# Patient Record
Sex: Male | Born: 1965 | Race: White | Hispanic: No | Marital: Married | Smoking: Former smoker
Health system: Southern US, Community
[De-identification: ages and names within clinical notes are randomized; demographics above are authoritative.]

## PROBLEM LIST (undated history)

## (undated) DIAGNOSIS — T7840XA Allergy, unspecified, initial encounter: Secondary | ICD-10-CM

## (undated) DIAGNOSIS — IMO0001 Reserved for inherently not codable concepts without codable children: Secondary | ICD-10-CM

## (undated) DIAGNOSIS — R030 Elevated blood-pressure reading, without diagnosis of hypertension: Secondary | ICD-10-CM

## (undated) DIAGNOSIS — E1165 Type 2 diabetes mellitus with hyperglycemia: Principal | ICD-10-CM

## (undated) HISTORY — PX: ANKLE ARTHROSCOPY: SUR85

## (undated) HISTORY — DX: Allergy, unspecified, initial encounter: T78.40XA

## (undated) HISTORY — DX: Type 2 diabetes mellitus with hyperglycemia: E11.65

## (undated) HISTORY — DX: Reserved for inherently not codable concepts without codable children: IMO0001

## (undated) HISTORY — DX: Elevated blood-pressure reading, without diagnosis of hypertension: R03.0

## (undated) HISTORY — PX: ELBOW ARTHROSCOPY: SUR87

---

## 2000-05-18 ENCOUNTER — Emergency Department (HOSPITAL_COMMUNITY): Admission: EM | Admit: 2000-05-18 | Discharge: 2000-05-18 | Payer: Self-pay | Admitting: Emergency Medicine

## 2000-05-18 ENCOUNTER — Encounter: Payer: Self-pay | Admitting: Emergency Medicine

## 2002-08-02 ENCOUNTER — Encounter: Admission: RE | Admit: 2002-08-02 | Discharge: 2002-10-31 | Payer: Self-pay | Admitting: Internal Medicine

## 2002-11-02 ENCOUNTER — Encounter: Admission: RE | Admit: 2002-11-02 | Discharge: 2003-01-31 | Payer: Self-pay | Admitting: Internal Medicine

## 2004-04-10 ENCOUNTER — Ambulatory Visit: Payer: Self-pay | Admitting: Internal Medicine

## 2005-03-06 ENCOUNTER — Ambulatory Visit: Payer: Self-pay | Admitting: Internal Medicine

## 2005-06-19 ENCOUNTER — Ambulatory Visit: Payer: Self-pay | Admitting: Internal Medicine

## 2005-06-27 ENCOUNTER — Ambulatory Visit: Payer: Self-pay | Admitting: Cardiology

## 2007-01-03 ENCOUNTER — Encounter: Payer: Self-pay | Admitting: Internal Medicine

## 2007-01-03 DIAGNOSIS — J301 Allergic rhinitis due to pollen: Secondary | ICD-10-CM | POA: Insufficient documentation

## 2007-01-03 DIAGNOSIS — I10 Essential (primary) hypertension: Secondary | ICD-10-CM | POA: Insufficient documentation

## 2007-09-15 ENCOUNTER — Encounter: Admission: RE | Admit: 2007-09-15 | Discharge: 2007-09-15 | Payer: Self-pay | Admitting: Orthopaedic Surgery

## 2008-07-07 ENCOUNTER — Other Ambulatory Visit: Payer: Self-pay

## 2008-07-07 ENCOUNTER — Other Ambulatory Visit: Payer: Self-pay | Admitting: *Deleted

## 2010-05-30 ENCOUNTER — Encounter: Payer: Self-pay | Admitting: Internal Medicine

## 2010-05-30 ENCOUNTER — Ambulatory Visit (INDEPENDENT_AMBULATORY_CARE_PROVIDER_SITE_OTHER): Payer: Commercial Managed Care - PPO | Admitting: Internal Medicine

## 2010-05-30 DIAGNOSIS — J301 Allergic rhinitis due to pollen: Secondary | ICD-10-CM

## 2010-05-30 DIAGNOSIS — R002 Palpitations: Secondary | ICD-10-CM

## 2010-05-30 DIAGNOSIS — I10 Essential (primary) hypertension: Secondary | ICD-10-CM

## 2010-05-31 ENCOUNTER — Other Ambulatory Visit: Payer: Commercial Managed Care - PPO

## 2010-05-31 ENCOUNTER — Other Ambulatory Visit: Payer: Self-pay | Admitting: Internal Medicine

## 2010-05-31 ENCOUNTER — Encounter (INDEPENDENT_AMBULATORY_CARE_PROVIDER_SITE_OTHER): Payer: Self-pay | Admitting: *Deleted

## 2010-05-31 DIAGNOSIS — Z0389 Encounter for observation for other suspected diseases and conditions ruled out: Secondary | ICD-10-CM

## 2010-05-31 DIAGNOSIS — I10 Essential (primary) hypertension: Secondary | ICD-10-CM

## 2010-05-31 DIAGNOSIS — E785 Hyperlipidemia, unspecified: Secondary | ICD-10-CM

## 2010-05-31 DIAGNOSIS — Z0000 Encounter for general adult medical examination without abnormal findings: Secondary | ICD-10-CM

## 2010-05-31 LAB — CBC WITH DIFFERENTIAL/PLATELET
Basophils Absolute: 0 10*3/uL (ref 0.0–0.1)
Basophils Relative: 0.7 % (ref 0.0–3.0)
Eosinophils Absolute: 0.3 10*3/uL (ref 0.0–0.7)
Eosinophils Relative: 4.4 % (ref 0.0–5.0)
HCT: 44.3 % (ref 39.0–52.0)
Hemoglobin: 15.6 g/dL (ref 13.0–17.0)
Lymphocytes Relative: 42 % (ref 12.0–46.0)
Lymphs Abs: 2.6 10*3/uL (ref 0.7–4.0)
MCHC: 35.1 g/dL (ref 30.0–36.0)
MCV: 89.2 fl (ref 78.0–100.0)
Monocytes Absolute: 0.4 10*3/uL (ref 0.1–1.0)
Monocytes Relative: 6.9 % (ref 3.0–12.0)
Neutro Abs: 2.9 10*3/uL (ref 1.4–7.7)
Neutrophils Relative %: 46 % (ref 43.0–77.0)
Platelets: 222 10*3/uL (ref 150.0–400.0)
RBC: 4.97 Mil/uL (ref 4.22–5.81)
RDW: 13.6 % (ref 11.5–14.6)
WBC: 6.3 10*3/uL (ref 4.5–10.5)

## 2010-05-31 LAB — URINALYSIS
Bilirubin Urine: NEGATIVE
Hgb urine dipstick: NEGATIVE
Ketones, ur: NEGATIVE
Leukocytes, UA: NEGATIVE
Nitrite: NEGATIVE
Specific Gravity, Urine: 1.01 (ref 1.000–1.030)
Total Protein, Urine: NEGATIVE
Urine Glucose: NEGATIVE
Urobilinogen, UA: 0.2 (ref 0.0–1.0)
pH: 7 (ref 5.0–8.0)

## 2010-05-31 LAB — LIPID PANEL
Cholesterol: 225 mg/dL — ABNORMAL HIGH (ref 0–200)
HDL: 41 mg/dL (ref >39.00)
Total CHOL/HDL Ratio: 5
Triglycerides: 131 mg/dL (ref 0.0–149.0)
VLDL: 26.2 mg/dL (ref 0.0–40.0)

## 2010-05-31 LAB — BASIC METABOLIC PANEL
BUN: 11 mg/dL (ref 6–23)
CO2: 32 mEq/L (ref 19–32)
Calcium: 9.6 mg/dL (ref 8.4–10.5)
Chloride: 96 mEq/L (ref 96–112)
Creatinine, Ser: 1.2 mg/dL (ref 0.4–1.5)
GFR: 68.87 mL/min (ref >60.00)
Glucose, Bld: 133 mg/dL — ABNORMAL HIGH (ref 70–99)
Potassium: 4.5 mEq/L (ref 3.5–5.1)
Sodium: 136 mEq/L (ref 135–145)

## 2010-05-31 LAB — HEPATIC FUNCTION PANEL
ALT: 43 U/L (ref 0–53)
AST: 30 U/L (ref 0–37)
Albumin: 4.5 g/dL (ref 3.5–5.2)
Alkaline Phosphatase: 42 U/L (ref 39–117)
Bilirubin, Direct: 0.1 mg/dL (ref 0.0–0.3)
Total Bilirubin: 0.7 mg/dL (ref 0.3–1.2)
Total Protein: 7.3 g/dL (ref 6.0–8.3)

## 2010-05-31 LAB — TSH: TSH: 2.7 u[IU]/mL (ref 0.35–5.50)

## 2010-05-31 LAB — PSA: PSA: 0.59 ng/mL (ref 0.10–4.00)

## 2010-05-31 LAB — LDL CHOLESTEROL, DIRECT: Direct LDL: 171 mg/dL

## 2010-06-07 NOTE — Assessment & Plan Note (Signed)
Summary: NEW TO RE-EST/ LOV 2007 / HEART FLUTTERS/[facility]   Vital Signs:  Patient profile:   45 year old male Height:      70 inches Weight:      232 pounds BMI:     33.41 Temp:     98.5 degrees F oral Pulse rate:   80 / minute Pulse rhythm:   regular Resp:     16 per minute BP sitting:   130 / 98  (left arm) Cuff size:   large  Vitals Entered By: Lanier Prude, CMA(AAMA) (May 30, 2010 11:13 AM) CC: heart fluttering X 5 days Is Patient Diabetic? No   CC:  heart fluttering X 5 days.  History of Present Illness: New pt to re-est with me. C/oepisodes of heart palpitations and "weard heart beat episode" x up to 1 hr on Fri pm and since then daily. No CP, SOB, sweats. Not related to activities.   Preventive Screening-Counseling & Management  Alcohol-Tobacco     Smoking Status: never  Caffeine-Diet-Exercise     Does Patient Exercise: yes  Current Medications (verified): 1)  Benadryl 2)  Cetirizine Hcl 10 Mg Tabs (Cetirizine Hcl) .Marland Kitchen.. 1 By Mouth Once Daily  Allergies (verified): No Known Drug Allergies  Past History:  Past Medical History: Elev BP  Family History: No heart issues  Social History: Occupation: Emergency planning/management officer Married 1 yo child Never Smoked Regular exercise-yes Does Patient Exercise:  yes  Review of Systems  The patient denies anorexia, fever, weight loss, weight gain, vision loss, decreased hearing, hoarseness, chest pain, syncope, dyspnea on exertion, peripheral edema, prolonged cough, headaches, hemoptysis, abdominal pain, melena, hematochezia, severe indigestion/heartburn, hematuria, incontinence, genital sores, muscle weakness, suspicious skin lesions, transient blindness, difficulty walking, depression, unusual weight change, abnormal bleeding, enlarged lymph nodes, angioedema, and breast masses.    Physical Exam  General:  Well-developed,well-nourished,in no acute distress; alert,appropriate and cooperative throughout examination Head:   Normocephalic and atraumatic without obvious abnormalities. No apparent alopecia or balding. Eyes:  No corneal or conjunctival inflammation noted. EOMI. Perrla. Ears:  External ear exam shows no significant lesions or deformities.  Otoscopic examination reveals clear canals, tympanic membranes are intact bilaterally without bulging, retraction, inflammation or discharge. Hearing is grossly normal bilaterally. Nose:  External nasal examination shows no deformity or inflammation. Nasal mucosa are pink and moist without lesions or exudates. Mouth:  Oral mucosa and oropharynx without lesions or exudates.  Teeth in good repair. Neck:  No deformities, masses, or tenderness noted. Lungs:  Normal respiratory effort, chest expands symmetrically. Lungs are clear to auscultation, no crackles or wheezes. Heart:  Normal rate and regular rhythm. S1 and S2 normal without gallop, murmur, click, rub or other extra sounds. Abdomen:  Bowel sounds positive,abdomen soft and non-tender without masses, organomegaly or hernias noted. Msk:  No deformity or scoliosis noted of thoracic or lumbar spine.   Pulses:  R and L carotid,radial,femoral,dorsalis pedis and posterior tibial pulses are full and equal bilaterally Extremities:  No clubbing, cyanosis, edema, or deformity noted with normal full range of motion of all joints.   Neurologic:  No cranial nerve deficits noted. Station and gait are normal. Plantar reflexes are down-going bilaterally. DTRs are symmetrical throughout. Sensory, motor and coordinative functions appear intact.   Impression & Recommendations:  Problem # 1:  PALPITATIONS (ICD-785.1) possible SVT Assessment New Diagnostic options incl. cardiol consult discussed - he declined Orders: EKG w/ Interpretation (93000) nl Labs ordered  His updated medication list for this problem includes:  Bystolic 10 Mg Tabs (Nebivolol hcl) .Marland Kitchen... 1 by mouth qd  Problem # 2:  HYPERTENSION (ICD-401.9) Assessment:  New  His updated medication list for this problem includes:    Bystolic 10 Mg Tabs (Nebivolol hcl) .Marland Kitchen... 1 by mouth qd  Problem # 3:  HAY FEVER (ICD-477.0) Assessment: Unchanged  Complete Medication List: 1)  Benadryl  2)  Cetirizine Hcl 10 Mg Tabs (Cetirizine hcl) .Marland Kitchen.. 1 by mouth once daily 3)  Bystolic 10 Mg Tabs (Nebivolol hcl) .Marland Kitchen.. 1 by mouth qd  Patient Instructions: 1)  Please schedule a follow-up appointment in 6 weeks. 2)  Next wk labs 3)  BMP prior to visit, ICD-9: v70.0  401.1 4)  Hepatic Panel prior to visit, ICD-9: 5)  Lipid Panel prior to visit, ICD-9: 6)  TSH prior to visit, ICD-9: 7)  CBC w/ Diff prior to visit, ICD-9: 8)  Urine-dip prior to visit, ICD-9: 9)  To ER if worse Prescriptions: BYSTOLIC 10 MG TABS (NEBIVOLOL HCL) 1 by mouth qd  #30 x 11   Entered and Authorized by:   Tresa Garter MD   Signed by:   Tresa Garter MD on 05/30/2010   Method used:   Print then Give to Patient   RxID:   [id number]    Orders Added: 1)  EKG w/ Interpretation [93000] 2)  New Patient Level IV [32951]

## 2010-07-12 ENCOUNTER — Ambulatory Visit: Payer: Commercial Managed Care - PPO | Admitting: Internal Medicine

## 2010-08-02 LAB — DIFFERENTIAL
Basophils Absolute: 0.1 10*3/uL (ref 0.0–0.1)
Basophils Relative: 1 % (ref 0–1)
Eosinophils Absolute: 0.3 10*3/uL (ref 0.0–0.7)
Eosinophils Relative: 5 % (ref 0–5)
Lymphocytes Relative: 25 % (ref 12–46)
Lymphs Abs: 1.7 10*3/uL (ref 0.7–4.0)
Monocytes Absolute: 0.6 10*3/uL (ref 0.1–1.0)
Monocytes Relative: 9 % (ref 3–12)
Neutro Abs: 4.3 10*3/uL (ref 1.7–7.7)
Neutrophils Relative %: 61 % (ref 43–77)

## 2010-08-02 LAB — CBC
HCT: 39.1 % (ref 39.0–52.0)
Hemoglobin: 13.6 g/dL (ref 13.0–17.0)
MCHC: 34.7 g/dL (ref 30.0–36.0)
MCV: 94.6 fL (ref 78.0–100.0)
Platelets: 175 10*3/uL (ref 150–400)
RBC: 4.13 MIL/uL — ABNORMAL LOW (ref 4.22–5.81)
RDW: 12.3 % (ref 11.5–15.5)
WBC: 7.1 10*3/uL (ref 4.0–10.5)

## 2010-08-02 LAB — RAPID URINE DRUG SCREEN, HOSP PERFORMED
Amphetamines: NOT DETECTED
Barbiturates: NOT DETECTED
Benzodiazepines: NOT DETECTED
Cocaine: NOT DETECTED
Opiates: NOT DETECTED
Tetrahydrocannabinol: NOT DETECTED

## 2010-08-02 LAB — SALICYLATE LEVEL: Salicylate Lvl: <4 mg/dL (ref 2.8–20.0)

## 2010-08-02 LAB — BASIC METABOLIC PANEL
BUN: 17 mg/dL (ref 6–23)
CO2: 27 mEq/L (ref 19–32)
Calcium: 9 mg/dL (ref 8.4–10.5)
Chloride: 106 mEq/L (ref 96–112)
Creatinine, Ser: 1.32 mg/dL (ref 0.4–1.5)
GFR calc Af Amer: >60 mL/min (ref >60)
GFR calc non Af Amer: 60 mL/min — ABNORMAL LOW (ref >60)
Glucose, Bld: 105 mg/dL — ABNORMAL HIGH (ref 70–99)
Potassium: 3.5 mEq/L (ref 3.5–5.1)
Sodium: 139 mEq/L (ref 135–145)

## 2010-08-02 LAB — TRICYCLICS SCREEN, URINE: TCA Scrn: NOT DETECTED

## 2010-08-02 LAB — ETHANOL: Alcohol, Ethyl (B): <5 mg/dL (ref 0–10)

## 2010-08-02 LAB — ACETAMINOPHEN LEVEL: Acetaminophen (Tylenol), Serum: <10 ug/mL — ABNORMAL LOW (ref 10–30)

## 2010-12-14 ENCOUNTER — Other Ambulatory Visit (INDEPENDENT_AMBULATORY_CARE_PROVIDER_SITE_OTHER): Payer: Commercial Managed Care - PPO

## 2010-12-14 ENCOUNTER — Encounter: Payer: Self-pay | Admitting: Internal Medicine

## 2010-12-14 ENCOUNTER — Ambulatory Visit (INDEPENDENT_AMBULATORY_CARE_PROVIDER_SITE_OTHER): Payer: Commercial Managed Care - PPO | Admitting: Internal Medicine

## 2010-12-14 VITALS — BP 128/80 | HR 66 | Temp 97.6°F | Ht 70.0 in | Wt 220.0 lb

## 2010-12-14 DIAGNOSIS — R002 Palpitations: Secondary | ICD-10-CM

## 2010-12-14 DIAGNOSIS — R310 Gross hematuria: Secondary | ICD-10-CM

## 2010-12-14 DIAGNOSIS — I10 Essential (primary) hypertension: Secondary | ICD-10-CM

## 2010-12-14 LAB — URINALYSIS, ROUTINE W REFLEX MICROSCOPIC
Bilirubin Urine: NEGATIVE
Leukocytes, UA: NEGATIVE
Nitrite: NEGATIVE
Specific Gravity, Urine: 1.025 (ref 1.000–1.030)
Total Protein, Urine: NEGATIVE
Urine Glucose: NEGATIVE
Urobilinogen, UA: 0.2 (ref 0.0–1.0)
pH: 6 (ref 5.0–8.0)

## 2010-12-14 MED ORDER — CIPROFLOXACIN HCL 500 MG PO TABS: 500.0000 mg | ORAL_TABLET | Freq: Two times a day (BID) | ORAL | Status: AC

## 2010-12-14 NOTE — Patient Instructions (Signed)
Take all new medications as prescribed Continue all other medications as before Please go to LAB in the Basement for the urine tests to be done today Please call the phone number [phone] (the [employer] System) for results of testing in 2-3 days;  When calling, simply dial the number, and when prompted enter the MRN number above (the Medical Record Number) and the # key, then the message should start. If the culture is negative, we may need to refer to urology

## 2010-12-14 NOTE — Progress Notes (Signed)
  Subjective:    Patient ID: Luis Bentley, male    DOB: [birth year], 45 y.o.   MRN: [id number]  HPI   Pt of Dr Posey Rea here for acute visit, c/o 2 episode of gross hematuria this am on urination;  No prior hx and  Denies urinary symptoms such as dysuria, frequency, urgency,or hematuria, n/v, fever, chills, back or flank pain.  Pt denies chest pain, increased sob or doe, wheezing, orthopnea, PND, increased LE swelling, palpitations, dizziness or syncope.  Pt denies new neurological symptoms such as new headache, or facial or extremity weakness or numbness   Pt denies polydipsia, polyuria.  Pt mentions he did not take the bystolic in samples recently, has been working on lifestyle changes, wt loss, exercise, low salt, and BP at home has been < 140. Past Medical History  Diagnosis Date  . Elevated blood pressure    No past surgical history on file.  reports that he has never smoked. He does not have any smokeless tobacco history on file. His alcohol and drug histories not on file. family history is negative for Heart disease. No Known Allergies Current Outpatient Prescriptions on File Prior to Visit  Medication Sig Dispense Refill  . cetirizine (ZYRTEC) 10 MG tablet Take 10 mg by mouth daily.        . DiphenhydrAMINE HCl (BENADRYL ALLERGY PO) Take by mouth.         Review of Systems Review of Systems  Constitutional: Negative for diaphoresis and unexpected weight change.  HENT: Negative for drooling and tinnitus.   Eyes: Negative for photophobia and visual disturbance.  Respiratory: Negative for choking and stridor.   Gastrointestinal: Negative for vomiting and blood in stool.   Musculoskeletal: Negative for gait problem.  Skin: Negative for color change and wound.  Neurological: Negative for tremors and numbness.  Psychiatric/Behavioral: Negative for decreased concentration. The patient is not hyperactive.       Objective:   Physical Exam BP 128/80  Pulse 66  Temp(Src) 97.6 F (36.4  C) (Oral)  Ht 5\' 10"  (1.778 m)  Wt 220 lb (99.791 kg)  BMI 31.57 kg/m2  SpO2 96% Physical Exam  VS noted Constitutional: Pt appears well-developed and well-nourished.  HENT: Head: Normocephalic.  Right Ear: External ear normal.  Left Ear: External ear normal.  Eyes: Conjunctivae and EOM are normal. Pupils are equal, round, and reactive to light.  Neck: Normal range of motion. Neck supple.  Cardiovascular: Normal rate and regular rhythm.   Pulmonary/Chest: Effort normal and breath sounds normal.  Abd:  Soft, NT, non-distended, + BS Neurological: Pt is alert. No cranial nerve deficit.  Skin: Skin is warm. No erythema.  Psychiatric: Pt behavior is normal. Thought content normal.         Assessment & Plan:

## 2010-12-14 NOTE — Assessment & Plan Note (Signed)
curretnly not taking the bystolic - working on lifestyle changes first - stable overall by hx and exam, most recent data reviewed with pt, and pt to continue medical treatment as before  BP Readings from Last 3 Encounters:  12/14/10 128/80  05/30/10 130/98  06/19/05 131/84

## 2010-12-16 ENCOUNTER — Other Ambulatory Visit: Payer: Self-pay | Admitting: Internal Medicine

## 2010-12-16 ENCOUNTER — Encounter: Payer: Self-pay | Admitting: Internal Medicine

## 2010-12-16 DIAGNOSIS — R310 Gross hematuria: Secondary | ICD-10-CM

## 2010-12-16 LAB — URINE CULTURE
Colony Count: NO GROWTH
Organism ID, Bacteria: NO GROWTH

## 2010-12-16 NOTE — Assessment & Plan Note (Signed)
stable overall by hx and exam, most recent data reviewed with pt, and pt to continue medical treatment as before  Lab Results  Component Value Date   WBC 6.3 05/31/2010   HGB 15.6 05/31/2010   HCT 44.3 05/31/2010   PLT 222.0 05/31/2010   CHOL 225* 05/31/2010   TRIG 131.0 05/31/2010   HDL 41.00 05/31/2010   LDLDIRECT 171.0 05/31/2010   ALT 43 05/31/2010   AST 30 05/31/2010   NA 136 05/31/2010   K 4.5 05/31/2010   CL 96 05/31/2010   CREATININE 1.2 05/31/2010   BUN 11 05/31/2010   CO2 32 05/31/2010   TSH 2.70 05/31/2010   PSA 0.59 05/31/2010

## 2010-12-16 NOTE — Assessment & Plan Note (Signed)
New onset, diff inclueds infectious, stone, malignancy;  For urine studies, empiric antbx to start, for urology if cx neg

## 2011-04-01 ENCOUNTER — Other Ambulatory Visit: Payer: Self-pay | Admitting: Urology

## 2011-04-01 ENCOUNTER — Encounter (HOSPITAL_BASED_OUTPATIENT_CLINIC_OR_DEPARTMENT_OTHER): Payer: Self-pay | Admitting: *Deleted

## 2011-04-01 NOTE — Progress Notes (Signed)
To [facility] at 1115-Hg,EKG on arrival.npo after mn.

## 2011-04-02 DIAGNOSIS — N201 Calculus of ureter: Secondary | ICD-10-CM | POA: Diagnosis present

## 2011-04-02 NOTE — H&P (Signed)
History of Present Illness   Luis Bentley returns for follow-up. Again, approximately 2-1/2 weeks ago he developed sudden onset of fairly severe left-sided renal colic. He was diagnosed with a 6 mm distal left ureteral stone on CT scan in [facility]. He has had some mild twinges of discomfort, but for the most part has done fairly well over the last 2 weeks. He has not obviously passed a stone. Urinalysis today is relatively unremarkable. On KUB, there is certainly a new calcification in the left hemi-pelvis relative to his most recent imaging studies consistent with, again, an approximate 4 x 6 mm stone in the distal left ureter just a centimeter or two above the ureterovesical junction. No other new complaints or issues today.   Past Medical History Problems  1. History of  Benign Prostatic Hypertrophy 600.00 2. History of  Hypertension 401.9  Surgical History Problems  1. History of  Ankle Surgery Left 2. History of  Elbow Surgery Right 3. History of  Shoulder Surgery Right  Current Meds 1. Alavert TABS; Therapy: (Recorded:13Sep2012) to 2. Ibuprofen TABS; Therapy: (Recorded:13Sep2012) to  Allergies Medication  1. No Known Drug Allergies  Family History Problems  1. Family history of  Family Health Status - Father's Age age 23 2. Family history of  Family Health Status - Mother's Age age 4 3. 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  Genitourinary: nocturia and hematuria.  Musculoskeletal: back pain and joint pain.     Vitals Vital Signs [Data Includes: Last 1 Day]  13Sep2012 02:24PM  BMI Calculated: 30.78 BSA Calculated: 2.15 Height: 5 ft 10 in Weight: 215 lb  Blood Pressure: 122 / 78 Temperature: 98.3 F Heart Rate: 66  Physical Exam Constitutional: Well nourished and well developed . No acute distress. The patient appears well hydrated.  ENT:. The ears and nose are normal in appearance.  Neck: The appearance of the neck is normal.  Pulmonary: No respiratory distress.  Cardiovascular: Heart rate and rhythm are normal.  Abdomen: The abdomen is flat. The abdomen is soft and nontender. No suprapubic tenderness. No CVA tenderness. Bowel sounds are normal. No hernias are palpable.  Rectal: Rectal exam demonstrates normal sphincter tone, the anus is normal on inspection. and no tenderness. Estimated prostate size is 1+. Normal rectal tone, no rectal masses, prostate is smooth, symmetric and non-tender. The prostate has no nodularity, is not indurated, is not tender and is not fluctuant. The perineum is normal on inspection, no perineal tenderness.  Genitourinary: Examination of the penis demonstrates a normal meatus. The penis is circumcised. The scrotum is normal in appearance. The right testis is palpably normal, not enlarged and non-tender. The left testis is normal, not enlarged and non-tender.  Lymphatics: The femoral and inguinal nodes are not enlarged or tender.  Skin: Normal skin turgor and normal skin color and pigmentation.  Neuro/Psych:. Mood and affect are appropriate.    Results/Data Urine [Data Includes: Last 1 Day]  13Sep2012  COLOR: YELLOW  Reference Range YELLOW APPEARANCE: CLEAR  Reference Range CLEAR SPECIFIC GRAVITY: 1.025  Reference Range 1.005-1.030 pH: 5.5  Reference Range 5.0-8.0 GLUCOSE: NEG mg/dL  Reference Range NEG BILIRUBIN: NEG  Reference Range NEG KETONE: NEG mg/dL Reference Range NEG BLOOD: NEG  Reference Range NEG PROTEIN: NEG mg/dL Reference Range NEG UROBILINOGEN:  0.2 mg/dL Reference Range 1.6-1.0 NITRITE: NEG  Reference Range NEG LEUKOCYTE ESTERASE: NEG  Reference Range NEG  Old records or history reviewed: Reviewed OV notes from referring MD.  The following images/tracing/specimen were independently visualized:  RUS: right kidney: 11.08 X 1.14 X 4.91 X 4.55 cm. ? renal pelvis stone: 0.46 cm. Left kidney; 12.14 X 1.46 X 5.20 X 4.32 cm. Appears normal.  The following clinical lab reports were reviewed:  UA NMP-22 PSA.  PVR: Ultrasound PVR 90.35 ml. Selected Results  PSA REFLEX TO FREE 13Sep2012 02:54PM Jetta Lout  SPECIMEN TYPE: BLOOD   Test Name Result Flag Reference  PSA 0.41 ng/mL  <=4.00  Test Methodology: ECLIA PSA (Electrochemiluminescence Immunoassay)   NMP22 13Sep2012 02:32PM Jetta Lout  SPECIMEN TYPE: URINE   Test Name Result Flag Reference  NMP22 Negative  Negative    Assessment Assessed  1. Distal Ureteral Stone On The Left 592.1  Plan Distal Ureteral Stone On The Left (592.1)  1. Follow-up Schedule Surgery Office  Follow-up  Requested for: 10Dec2012  Discussion/Summary   Luis Bentley appears to have continued presence of a 6 mm distal left ureteral stone. There are no absolute indications for intervention. He understands that overall a stone this size will pass approximately 50% of the time. With the holidays approaching as well as some issues with regard to work, he would just assume to try to have something done definitive. His options would include shock-wave lithotripsy versus ureteroscopy. We went over success rates of those. He is told that overall success rates are going to be higher with ureteroscopy in this setting. We did discuss the procedure and the possibility of J-J stent requirements. We are going to see if we can get him on the schedule for later this week. Obviously, if he passes the stone we would be delighted to cancel the procedure.

## 2011-04-03 ENCOUNTER — Encounter (HOSPITAL_BASED_OUTPATIENT_CLINIC_OR_DEPARTMENT_OTHER): Payer: Self-pay | Admitting: Certified Registered"

## 2011-04-03 ENCOUNTER — Encounter (HOSPITAL_BASED_OUTPATIENT_CLINIC_OR_DEPARTMENT_OTHER): Payer: Self-pay | Admitting: *Deleted

## 2011-04-03 ENCOUNTER — Ambulatory Visit (HOSPITAL_BASED_OUTPATIENT_CLINIC_OR_DEPARTMENT_OTHER)
Admission: RE | Admit: 2011-04-03 | Discharge: 2011-04-03 | Disposition: A | Discharge status: Home / Self Care | Payer: Commercial Managed Care - PPO | Source: Ambulatory Visit | Attending: Urology | Admitting: Urology

## 2011-04-03 ENCOUNTER — Encounter (HOSPITAL_BASED_OUTPATIENT_CLINIC_OR_DEPARTMENT_OTHER)
Admission: RE | Disposition: A | Discharge status: Home / Self Care | Payer: Self-pay | Source: Ambulatory Visit | Attending: Urology

## 2011-04-03 ENCOUNTER — Ambulatory Visit (HOSPITAL_BASED_OUTPATIENT_CLINIC_OR_DEPARTMENT_OTHER): Payer: Commercial Managed Care - PPO | Admitting: Certified Registered"

## 2011-04-03 DIAGNOSIS — Z79899 Other long term (current) drug therapy: Secondary | ICD-10-CM | POA: Insufficient documentation

## 2011-04-03 DIAGNOSIS — I10 Essential (primary) hypertension: Secondary | ICD-10-CM | POA: Insufficient documentation

## 2011-04-03 DIAGNOSIS — N400 Enlarged prostate without lower urinary tract symptoms: Secondary | ICD-10-CM | POA: Insufficient documentation

## 2011-04-03 DIAGNOSIS — N201 Calculus of ureter: Secondary | ICD-10-CM | POA: Diagnosis present

## 2011-04-03 HISTORY — PX: CYSTOSCOPY/RETROGRADE/URETEROSCOPY: SHX5316

## 2011-04-03 LAB — POCT HEMOGLOBIN-HEMACUE: Hemoglobin: 14.6 g/dL (ref 13.0–17.0)

## 2011-04-03 SURGERY — CYSTOSCOPY/RETROGRADE/URETEROSCOPY
Anesthesia: General | Site: Ureter | Wound class: Clean Contaminated

## 2011-04-03 MED ORDER — LIDOCAINE HCL (CARDIAC) 20 MG/ML IV SOLN
INTRAVENOUS | Status: DC | PRN
  Administered 2011-04-03: 80 mg via INTRAVENOUS

## 2011-04-03 MED ORDER — PROPOFOL 10 MG/ML IV EMUL
INTRAVENOUS | Status: DC | PRN
  Administered 2011-04-03: 100 mg via INTRAVENOUS
  Administered 2011-04-03: 200 mg via INTRAVENOUS

## 2011-04-03 MED ORDER — DEXAMETHASONE SODIUM PHOSPHATE 4 MG/ML IJ SOLN
INTRAMUSCULAR | Status: DC | PRN
  Administered 2011-04-03: 8 mg via INTRAVENOUS

## 2011-04-03 MED ORDER — ONDANSETRON HCL 4 MG/2ML IJ SOLN
INTRAMUSCULAR | Status: DC | PRN
  Administered 2011-04-03: 4 mg via INTRAVENOUS

## 2011-04-03 MED ORDER — URIBEL 118 MG PO CAPS: 1.0000 | ORAL_CAPSULE | Freq: Three times a day (TID) | ORAL | Status: DC | PRN

## 2011-04-03 MED ORDER — MIDAZOLAM HCL 5 MG/5ML IJ SOLN
INTRAMUSCULAR | Status: DC | PRN
  Administered 2011-04-03: 2 mg via INTRAVENOUS

## 2011-04-03 MED ORDER — IOHEXOL 350 MG/ML SOLN
INTRAVENOUS | Status: DC | PRN
  Administered 2011-04-03: 50 mL

## 2011-04-03 MED ORDER — BELLADONNA ALKALOIDS-OPIUM 16.2-60 MG RE SUPP
RECTAL | Status: DC | PRN
  Administered 2011-04-03: 1 via RECTAL

## 2011-04-03 MED ORDER — PROMETHAZINE HCL 25 MG/ML IJ SOLN: 6.2500 mg | INTRAMUSCULAR | Status: DC | PRN

## 2011-04-03 MED ORDER — SODIUM CHLORIDE 0.9 % IR SOLN
Status: DC | PRN
  Administered 2011-04-03: 6000 mL

## 2011-04-03 MED ORDER — LACTATED RINGERS IV SOLN
INTRAVENOUS | Status: DC
  Administered 2011-04-03: 12:00:00 via INTRAVENOUS

## 2011-04-03 MED ORDER — FENTANYL CITRATE 0.05 MG/ML IJ SOLN
INTRAMUSCULAR | Status: DC | PRN
  Administered 2011-04-03 (×3): 50 ug via INTRAVENOUS

## 2011-04-03 MED ORDER — CIPROFLOXACIN IN D5W 400 MG/200ML IV SOLN
400.0000 mg | INTRAVENOUS | Status: AC
  Administered 2011-04-03: 400 mg via INTRAVENOUS

## 2011-04-03 MED ORDER — FENTANYL CITRATE 0.05 MG/ML IJ SOLN: 25.0000 ug | INTRAMUSCULAR | Status: DC | PRN

## 2011-04-03 MED ORDER — LIDOCAINE HCL 2 % EX GEL
CUTANEOUS | Status: DC | PRN
  Administered 2011-04-03: 1

## 2011-04-03 SURGICAL SUPPLY — 34 items
ADAPTER CATH URET PLST 4-6FR (CATHETERS) IMPLANT
ADPR CATH URET STRL DISP 4-6FR (CATHETERS)
APL SKNCLS STERI-STRIP NONHPOA (GAUZE/BANDAGES/DRESSINGS) ×2
BAG DRAIN URO-CYSTO SKYTR STRL (DRAIN) ×3 IMPLANT
BAG DRN UROCATH (DRAIN) ×2
BASKET ZERO TIP NITINOL 2.4FR (BASKET) ×1 IMPLANT
BENZOIN TINCTURE PRP APPL 2/3 (GAUZE/BANDAGES/DRESSINGS) ×1 IMPLANT
BSKT STON RTRVL ZERO TP 2.4FR (BASKET) ×2
CANISTER SUCT LVC 12 LTR MEDI- (MISCELLANEOUS) ×1 IMPLANT
CATH INTERMIT  6FR 70CM (CATHETERS) ×1 IMPLANT
CATH URET 5FR 28IN CONE TIP (BALLOONS)
CATH URET 5FR 28IN OPEN ENDED (CATHETERS) IMPLANT
CATH URET 5FR 70CM CONE TIP (BALLOONS) IMPLANT
CLOTH BEACON ORANGE TIMEOUT ST (SAFETY) ×3 IMPLANT
DRAPE CAMERA CLOSED 9X96 (DRAPES) ×3 IMPLANT
GLOVE BIO SURGEON STRL SZ7.5 (GLOVE) ×3 IMPLANT
GLOVE BIOGEL PI IND STRL 6.5 (GLOVE) IMPLANT
GLOVE BIOGEL PI INDICATOR 6.5 (GLOVE) ×3
GOWN STRL NON-REIN LRG LVL3 (GOWN DISPOSABLE) ×2 IMPLANT
GOWN STRL REIN XL XLG (GOWN DISPOSABLE) ×3 IMPLANT
GUIDEWIRE 0.038 PTFE COATED (WIRE) IMPLANT
GUIDEWIRE ANG ZIPWIRE 038X150 (WIRE) IMPLANT
GUIDEWIRE STR DUAL SENSOR (WIRE) ×1 IMPLANT
IV NS IRRIG 3000ML ARTHROMATIC (IV SOLUTION) ×6 IMPLANT
KIT BALLIN UROMAX 15FX10 (LABEL) IMPLANT
KIT BALLN UROMAX 15FX4 (MISCELLANEOUS) IMPLANT
KIT BALLN UROMAX 26 75X4 (MISCELLANEOUS)
LASER FIBER DISP (UROLOGICAL SUPPLIES) ×1 IMPLANT
NS IRRIG 500ML POUR BTL (IV SOLUTION) IMPLANT
PACK CYSTOSCOPY (CUSTOM PROCEDURE TRAY) ×3 IMPLANT
SET HIGH PRES BAL DIL (LABEL)
SHEATH URET ACCESS 12FR/35CM (UROLOGICAL SUPPLIES) ×1 IMPLANT
SHEATH URET ACCESS 12FR/55CM (UROLOGICAL SUPPLIES) IMPLANT
STENT URET 6FRX24 CONTOUR (STENTS) ×1 IMPLANT

## 2011-04-03 NOTE — Anesthesia Procedure Notes (Signed)
Procedure Name: LMA Insertion Date/Time: 04/03/2011 12:37 PM Performed by: Renella Cunas D Pre-anesthesia Checklist: Patient identified, Emergency Drugs available, Suction available and Patient being monitored Patient Re-evaluated:Patient Re-evaluated prior to inductionOxygen Delivery Method: Circle System Utilized Preoxygenation: Pre-oxygenation with 100% oxygen Intubation Type: IV induction Ventilation: Mask ventilation without difficulty LMA: LMA inserted LMA Size: 4.0 Number of attempts: 1 Placement Confirmation: positive ETCO2 Tube secured with: Tape Dental Injury: Teeth and Oropharynx as per pre-operative assessment

## 2011-04-03 NOTE — Transfer of Care (Signed)
Immediate Anesthesia Transfer of Care Note  Patient: Luis Bentley  Procedure(s) Performed:  CYSTOSCOPY/RETROGRADE/URETEROSCOPY; HOLMIUM LASER APPLICATION  Patient Location: PACU  Anesthesia Type: General  Level of Consciousness: awake, oriented, sedated and patient cooperative  Airway & Oxygen Therapy: Patient Spontanous Breathing and Patient connected to face mask oxygen  Post-op Assessment: Report given to PACU RN and Post -op Vital signs reviewed and stable  Post vital signs: Reviewed and stable  Complications: No apparent anesthesia complications

## 2011-04-03 NOTE — Anesthesia Preprocedure Evaluation (Signed)
Anesthesia Evaluation  Patient identified by MRN, date of birth, ID band Patient awake    Reviewed: Allergy & Precautions, H&P , NPO status , Patient's Chart, lab work & pertinent test results, reviewed documented beta blocker date and time   Airway Mallampati: II TM Distance: >3 FB Neck ROM: Full    Dental  (+) Caps and Dental Advisory Given   Pulmonary neg pulmonary ROS,  clear to auscultation        Cardiovascular Regular Normal Pt denies HTN. ?non-compliant w/ meds Denies cardiac symptoms   Neuro/Psych Negative Neurological ROS  Negative Psych ROS   GI/Hepatic negative GI ROS, Neg liver ROS,   Endo/Other  Negative Endocrine ROS  Renal/GU Kidney stone   Genitourinary negative   Musculoskeletal negative musculoskeletal ROS (+)   Abdominal   Peds negative pediatric ROS (+)  Hematology negative hematology ROS (+)   Anesthesia Other Findings Upper front cap  Reproductive/Obstetrics negative OB ROS                           Anesthesia Physical Anesthesia Plan  ASA: II  Anesthesia Plan: General   Post-op Pain Management:    Induction: Intravenous  Airway Management Planned: LMA  Additional Equipment:   Intra-op Plan:   Post-operative Plan: Extubation in OR  Informed Consent: I have reviewed the patients History and Physical, chart, labs and discussed the procedure including the risks, benefits and alternatives for the proposed anesthesia with the patient or authorized representative who has indicated his/her understanding and acceptance.     Plan Discussed with: CRNA and Surgeon  Anesthesia Plan Comments:         Anesthesia Quick Evaluation

## 2011-04-03 NOTE — Interval H&P Note (Signed)
No change in H and P. Risks/ benefits of treatemnt discussed.

## 2011-04-03 NOTE — Interval H&P Note (Signed)
History and Physical Interval Note:  04/03/2011 12:31 PM  Luis Bentley  has presented today for surgery, with the diagnosis of Left ureteral Calculus  The various methods of treatment have been discussed with the patient and family. After consideration of risks, benefits and other options for treatment, the patient has consented to  Procedure(s): CYSTOSCOPY/RETROGRADE/URETEROSCOPY HOLMIUM LASER APPLICATION as a surgical intervention .  The patients' history has been reviewed, patient examined, no change in status, stable for surgery.  I have reviewed the patients' chart and labs.  Questions were answered to the patient's satisfaction.     [name],Kalep S

## 2011-04-03 NOTE — Op Note (Signed)
Preoperative diagnosis: Left ureteral calculus  Postoperative diagnosis: Left ureteral calculus  Procedure:  1. Cystoscopy 2. left ureteroscopy and stone removal 3. Ureteroscopic laser lithotripsy 4. left ureteral stent placement ([id number] 5. left retrograde pyelography with interpretation  Surgeon: Valetta Fuller, MD  Anesthesia: General  Complications: None  Intraoperative findings: left retrograde pyelography demonstrated a filling defect within the distal ureter consistent with the patient's known calculus without other abnormalities.  EBL: Minimal  Specimens: 1. Left ureteral calculus  Disposition of specimens: [facility] for stone analysis  Indication: Luis Bentley is a 45 y.o.   patient with urolithiasis. After reviewing the management options for treatment, the patient elected to proceed with the above surgical procedure(s). We have discussed the potential benefits and risks of the procedure, side effects of the proposed treatment, the likelihood of the patient achieving the goals of the procedure, and any potential problems that might occur during the procedure or recuperation. Informed consent has been obtained.  Description of procedure:  The patient was taken to the operating room and general anesthesia was induced.  The patient was placed in the dorsal lithotomy position, prepped and draped in the usual sterile fashion, and preoperative antibiotics were administered. A preoperative time-out was performed.   Cystourethroscopy was performed.  The patient's urethra was examined and was normal. The bladder was then systematically examined in its entirety. There was no evidence for any bladder tumors, stones, or other mucosal pathology.    Attention then turned to the left ureteral orifice and a ureteral catheter was used to intubate the ureteral orifice.  Omnipaque contrast was injected through the ureteral catheter and a retrograde pyelogram was  performed with findings as dictated above.  A 0.38 sensor guidewire was then advanced up the left ureter into the renal pelvis under fluoroscopic guidance. The 6 Fr semirigid ureteroscope was then advanced into the ureter next to the guidewire and the calculus was identified.   The stone was then fragmented with the 365 micron holmium laser fiber on a setting of 5J and frequency of 8 Hz.   All stones were then removed from the ureter with a zero tip nitinol basket.  Reinspection of the ureter revealed no remaining visible stones or fragments.   The wire was then backloaded through the cystoscope and a ureteral stent was advance over the wire using Seldinger technique.  The stent was positioned appropriately under fluoroscopic and cystoscopic guidance.  The wire was then removed with an adequate stent curl noted in the renal pelvis as well as in the bladder.  The bladder was then emptied and the procedure ended.  The patient appeared to tolerate the procedure well and without complications.  The patient was able to be awakened and transferred to the recovery unit in satisfactory condition.

## 2011-04-03 NOTE — Progress Notes (Signed)
uribel i tab po given per [employer]

## 2011-04-03 NOTE — OR Nursing (Signed)
Stent placed in left ureter with string by Dr. Isabel Caprice.

## 2011-04-04 ENCOUNTER — Encounter (HOSPITAL_BASED_OUTPATIENT_CLINIC_OR_DEPARTMENT_OTHER): Payer: Self-pay | Admitting: Urology

## 2011-04-04 NOTE — Anesthesia Postprocedure Evaluation (Signed)
  Anesthesia Post-op Note  Patient: Luis Bentley  Procedure(s) Performed:  CYSTOSCOPY/RETROGRADE/URETEROSCOPY; HOLMIUM LASER APPLICATION  Patient Location: PACU  Anesthesia Type: General  Level of Consciousness: oriented and sedated  Airway and Oxygen Therapy: Patient Spontanous Breathing  Post-op Pain: mild  Post-op Assessment: Post-op Vital signs reviewed, Patient's Cardiovascular Status Stable, Respiratory Function Stable and Patent Airway  Post-op Vital Signs: stable  Complications: No apparent anesthesia complications

## 2011-09-05 ENCOUNTER — Ambulatory Visit (INDEPENDENT_AMBULATORY_CARE_PROVIDER_SITE_OTHER): Payer: Commercial Managed Care - PPO | Admitting: Family Medicine

## 2011-09-05 VITALS — BP 128/85 | HR 84 | Temp 98.7°F | Resp 16 | Ht 70.0 in | Wt 223.0 lb

## 2011-09-05 DIAGNOSIS — J019 Acute sinusitis, unspecified: Secondary | ICD-10-CM

## 2011-09-05 DIAGNOSIS — J029 Acute pharyngitis, unspecified: Secondary | ICD-10-CM

## 2011-09-05 LAB — POCT RAPID STREP A (OFFICE): Rapid Strep A Screen: NEGATIVE

## 2011-09-05 MED ORDER — FLUTICASONE PROPIONATE 50 MCG/ACT NA SUSP: 2.0000 | Freq: Every day | NASAL | Status: DC

## 2011-09-05 MED ORDER — AMOXICILLIN 875 MG PO TABS: 875.0000 mg | ORAL_TABLET | Freq: Two times a day (BID) | ORAL | Status: AC

## 2011-09-05 NOTE — Progress Notes (Signed)
  Subjective:    Patient ID: Luis Bentley, male    DOB: 1965/04/25, 46 y.o.   MRN: [id number]  HPI 46 yo male here with concern for sinusitis.  Sore throat, chills, headache, sinus congestion, PND, and ear fullness for 3 days.  Son sick as well.    Review of Systems Negative except as per HPI     Objective:   Physical Exam  Constitutional: He appears well-developed. No distress.  HENT:  Right Ear: Tympanic membrane, external ear and ear canal normal. Tympanic membrane is not injected, not scarred, not perforated, not erythematous, not retracted and not bulging.  Left Ear: Tympanic membrane, external ear and ear canal normal. Tympanic membrane is not injected, not scarred, not perforated, not erythematous, not retracted and not bulging.  Nose: No mucosal edema or rhinorrhea. Right sinus exhibits no maxillary sinus tenderness and no frontal sinus tenderness. Left sinus exhibits no maxillary sinus tenderness and no frontal sinus tenderness.  Mouth/Throat: Uvula is midline and mucous membranes are normal. Posterior oropharyngeal erythema present. No oropharyngeal exudate or tonsillar abscesses.  Cardiovascular: Normal rate, regular rhythm, normal heart sounds and intact distal pulses.   No murmur heard. Pulmonary/Chest: Effort normal and breath sounds normal. No respiratory distress. He has no wheezes. He has no rales.  Lymphadenopathy:       Head (right side): No submandibular and no preauricular adenopathy present.       Head (left side): No submandibular and no preauricular adenopathy present.       Right cervical: No superficial cervical and no posterior cervical adenopathy present.      Left cervical: No superficial cervical and no posterior cervical adenopathy present.       Right: No supraclavicular adenopathy present.       Left: No supraclavicular adenopathy present.  Skin: Skin is warm and dry.      Results for orders placed in visit on 09/05/11  POCT RAPID STREP A (OFFICE)        Component Value Range   Rapid Strep A Screen Negative  Negative        Assessment & Plan:  URI/early sinusitis - amoxicillin and flonse.

## 2011-11-14 ENCOUNTER — Encounter: Payer: Self-pay | Admitting: Internal Medicine

## 2011-11-14 ENCOUNTER — Ambulatory Visit (INDEPENDENT_AMBULATORY_CARE_PROVIDER_SITE_OTHER): Payer: Commercial Managed Care - PPO | Admitting: Internal Medicine

## 2011-11-14 VITALS — BP 132/82 | HR 89 | Temp 98.1°F | Ht 70.0 in | Wt 226.1 lb

## 2011-11-14 DIAGNOSIS — IMO0001 Reserved for inherently not codable concepts without codable children: Secondary | ICD-10-CM | POA: Insufficient documentation

## 2011-11-14 HISTORY — DX: Reserved for inherently not codable concepts without codable children: IMO0001

## 2011-11-14 MED ORDER — METFORMIN HCL 500 MG PO TABS: 500.0000 mg | ORAL_TABLET | Freq: Two times a day (BID) | ORAL | Status: DC

## 2011-11-14 MED ORDER — LANCETS MISC: 1.0000 "application " | Freq: Every day | Status: DC

## 2011-11-14 MED ORDER — GLUCOSE BLOOD VI STRP: ORAL_STRIP | Status: DC

## 2011-11-14 NOTE — Progress Notes (Signed)
  Subjective:    Patient ID: Luis Bentley, male    DOB: 1965/11/10, 46 y.o.   MRN: [id number]  HPI  Pt of Dr Posey Rea, here as no visits open on PCP schedule;  Had elevated BS 281, and f/u a1c of 8.1 per Dr Grapey/urology.  Chart review shows glc 133 with labs feb 2012.  Pt denies chest pain, increased sob or doe, wheezing, orthopnea, PND, increased LE swelling, palpitations, dizziness or syncope.   Pt denies polydipsia, polyuria,.  Pt states overall good compliance with current meds, not really trying to follow lower cholesterol diet, wt overall up several lbs in the past yr and little exercise due to family and work.  Pt denies new neurological symptoms such as new headache, or facial or extremity weakness or numbness  Wife has had gestational DM so he well aware of how to check cbg's.   Past Medical History  Diagnosis Date  . Elevated blood pressure     few elevated readings-never used medication  . Type II or unspecified type diabetes mellitus without mention of complication, uncontrolled 11/14/2011   Past Surgical History  Procedure Date  . Elbow arthroscopy   . Ankle arthroscopy   . Cystoscopy/retrograde/ureteroscopy 04/03/2011    Procedure: CYSTOSCOPY/RETROGRADE/URETEROSCOPY;  Surgeon: Valetta Fuller, MD;  Location: [facility];  Service: Urology;  Laterality: Left;    reports that he has quit smoking. He quit smokeless tobacco use about 14 years ago. He reports that he drinks alcohol. His drug history not on file. family history is negative for Heart disease. No Known Allergies Current Outpatient Prescriptions on File Prior to Visit  Medication Sig Dispense Refill  . cetirizine (ZYRTEC) 10 MG tablet Take 10 mg by mouth daily.        . DiphenhydrAMINE HCl (BENADRYL ALLERGY PO) Take by mouth.        . fluticasone (FLONASE) 50 MCG/ACT nasal spray Place 2 sprays into the nose daily.  16 g  6  . Ketorolac Tromethamine (SPRIX NA) Place into the nose.        . metFORMIN  (GLUCOPHAGE) 500 MG tablet Take 1 tablet (500 mg total) by mouth 2 (two) times daily with a meal.  90 tablet  3  . Meth-Hyo-M Bl-Na Phos-Ph Sal (URIBEL) 118 MG CAPS Take 1 capsule (118 mg total) by mouth 3 (three) times daily as needed.  15 capsule  1   Review of Systems All otherwise neg per pt    Objective:   Physical Exam BP 132/82  Pulse 89  Temp 98.1 F (36.7 C) (Oral)  Ht 5\' 10"  (1.778 m)  Wt 226 lb 2 oz (102.57 kg)  BMI 32.45 kg/m2  SpO2 94% Physical Exam  VS noted Constitutional: Pt appears well-developed and well-nourished.  HENT: Head: Normocephalic.  Right Ear: External ear normal.  Left Ear: External ear normal.  Eyes: Conjunctivae and EOM are normal. Pupils are equal, round, and reactive to light.  Neck: Normal range of motion. Neck supple.  Cardiovascular: Normal rate and regular rhythm.   Pulmonary/Chest: Effort normal and breath sounds normal.  Neurological: Pt is alert..  Skin: Skin is warm. No erythema. No rash Psychiatric: Pt behavior is normal. Thought content normal.     Assessment & Plan:

## 2011-11-14 NOTE — Assessment & Plan Note (Signed)
New onset, for glucometer and supplies, start metformin 500 qd, refer nutrition, and f/u with labs with PCP in 6 wks

## 2011-11-14 NOTE — Patient Instructions (Addendum)
Take all new medications as prescribed Continue all other medications as before You are given the glucometer and strips/lancets today You will be contacted regarding the referral for: Diabetic education Please return in 6 weeks to Dr Posey Rea with Lab testing done 3-5 days before

## 2011-11-22 ENCOUNTER — Other Ambulatory Visit: Payer: Self-pay

## 2011-11-22 ENCOUNTER — Telehealth: Payer: Self-pay | Admitting: Internal Medicine

## 2011-11-22 DIAGNOSIS — IMO0001 Reserved for inherently not codable concepts without codable children: Secondary | ICD-10-CM

## 2011-11-22 MED ORDER — GLUCOSE BLOOD VI STRP: 1.0000 | ORAL_STRIP | Freq: Every day | Status: DC

## 2011-11-22 NOTE — Telephone Encounter (Signed)
Caller: Celestia Khat From [facility] calling. Elnita Maxwell; PCP: Oliver Barre; CB#: [phone];  Call regarding  need to  Calify Accu Check Strips and Lancets instructions.   EPIC and pt's scripts  do not clarify how may times a day the pt is to test and the pt does not know.  [employer] company will no longer accept to "use as inst".  Please call pharmacy to advise.

## 2011-11-26 ENCOUNTER — Telehealth: Payer: Self-pay

## 2011-11-26 MED ORDER — ACCU-CHEK FASTCLIX LANCETS MISC: 1.0000 | Freq: Every day | Status: AC

## 2011-11-26 MED ORDER — GLUCOSE BLOOD VI STRP: ORAL_STRIP | Status: AC

## 2011-11-26 NOTE — Telephone Encounter (Signed)
Patient called requesting the correct test strips and lancets to go with his Accu Chek Nano.  Incorrect strips were sent in at [facility].  Called pharmacy to clarify that prescriptions are correct for this patient and called the patient informed of prescription correction.  Informed if have further problems to please call our office and ask for [facility] CMA.

## 2011-11-29 ENCOUNTER — Ambulatory Visit: Payer: Commercial Managed Care - PPO | Admitting: Endocrinology

## 2011-12-11 ENCOUNTER — Encounter: Payer: Commercial Managed Care - PPO | Attending: Internal Medicine | Admitting: Dietician

## 2011-12-11 ENCOUNTER — Encounter: Payer: Self-pay | Admitting: Dietician

## 2011-12-11 VITALS — Ht 70.0 in | Wt 214.4 lb

## 2011-12-11 DIAGNOSIS — Z713 Dietary counseling and surveillance: Secondary | ICD-10-CM | POA: Insufficient documentation

## 2011-12-11 DIAGNOSIS — E119 Type 2 diabetes mellitus without complications: Secondary | ICD-10-CM | POA: Insufficient documentation

## 2011-12-11 DIAGNOSIS — IMO0001 Reserved for inherently not codable concepts without codable children: Secondary | ICD-10-CM

## 2011-12-11 NOTE — Patient Instructions (Signed)
   Continue to monitor one time per day.  For the fasting level aim to stay around the 90-110 range.  Try to get some post meal glucose levels and for these, a goal would be the 140 mg/dl level rather than the 161 mg/dl.  Continue your regular exercise routine.  Plan to have the mid-morning snack, especially when biking.  Plan to cover the exercise with some carb in the form of the snacks you are currently using.  Consider getting some glucose tablets to have available for use in the morning hours when on the job and your snack Tashala Cumbo be delayed.  Continue to read labels and keep your carbs at the 45-60 gm for meals and at least 15 gm for snacks.    Plan to have a protein at all meals and snacks.  On the mornings you have the protein shake at 19 gm of CHO, do plan to have that mid-morning snack of 30 gm of carb.  Try using the Glucose Buddy App for recording blood glucose levels.  Keep using the My Fitness Pal.  It is working well for you.  Recommend the 1700-1800 calories per day for you.  Call or e-mail with any questions.

## 2011-12-11 NOTE — Progress Notes (Signed)
  Medical Nutrition Therapy:  Appt start time: 0900 end time:  1000.   Assessment:  Primary concerns today: Knowing how to take care of my diabetes and get off of the metformin. Has been working with the information his wife received in GDM class regarding nutrition and diabetes.  He is counting calories and carbs by using an App "My fitness Pal".  He is trying to keep himself at 1700 calories per day and to regularly exercise.  Since his MD appointment on 7/25/203 he has lost 11.7 lb.    BLOOD GLUCOSE: Checking blood glucose at this time one time per day, usually fasting.  At 4:45 before running glucose is 85-100 mg.  At 7:00 AM on the days when he is not running, 103-108  HYPOGLYCEMIA:   Had one episode of feeling low, but did not have a meter with him.  At the time he ate something and found he felt better.  Currently, his carb intake at breakfast is at 19-20 gm and he notes he needs a mid-morning snack because he is getting hungry.    HYPERGLYCEMIA: Does not note any current S/S of extremely high blood glucose readings.  MEDICATIONS: Medication review completed. Metformin 500 mg AM and PM for glucose control.   DIETARY INTAKE: Usual eating pattern includes 3 meals and 2-3 snacks per day.  Everyday foods include lean meat and potatoes/starches some veggies.  Avoided foods include vegetables.    24-hr recall:  B ( AM): 8:00 Protein shake: almond milk, kale, protein powder, PB 2 powder/PD with frozen banana (Pro =33) CHO 19   Snk ( AM): Power gel or Honey stinger when riding 19 gm carb. Or 1/2 banana and PB before ride. Fit and Active bar: meal bar 26 CHO L ( PM): 12:00 baked chicken and wrap with mayo and chesse, apple or sandwich with Pb. Snk ( PM): 1/4 cup cashews. D ( PM): 6:30 baked spaghetti noodles and ground [place], 1 cup about 30 gm baked sweet potato Snk ( PM): usuall depends on hunger, almond butter or yogurt [place] yogurt. Beverages: water, 1 diet soda per day, water with Mio       Usual physical activity: Running 3 days during the week and cross country bike riding on Saturdays for 30-40 miles.  Estimated energy needs:Ht:70 in  WT:214.4 lb BMI:30.8 kg.m2  Adj. WT:   180 lb (82 kg)  His goal for weigh is 175 lb 1700-1800 calories 190-195 g carbohydrates 125-130 g protein 45-47 g fat  Progress Towards Goal(s):  In progress.   Nutritional Diagnosis:  Lamar-2.1 Inpaired nutrition utilization As related to blood glucose/glucose.  As evidenced by Diagnosis of type 2 diabetes with an A1C of 14.0% and a lifestyle at the time of inactivity..    Intervention:  Nutrition Review of the carb restrictive diet and the need for regular meals and snacks.  Review of carb counting and label reading..  Handouts given during visit include:  Living Well with diabetes  Carb counting Guide  Controlling Blood Glucose  Yellow Card with dietary prescription for 1700-1800 calories.  Monitoring/Evaluation:  Dietary intake, exercise, blood glucose levesl, and body weight as needed, will call or e-mail with questions and make an appointment if needed.

## 2011-12-25 ENCOUNTER — Telehealth: Payer: Self-pay | Admitting: Internal Medicine

## 2011-12-25 NOTE — Telephone Encounter (Signed)
Pt needs to have orders put in for labs tomorrow before his appt

## 2011-12-25 NOTE — Telephone Encounter (Signed)
A1c BMET lipids TSH Thx

## 2011-12-25 NOTE — Telephone Encounter (Signed)
Please advise what labs.  

## 2011-12-26 ENCOUNTER — Encounter: Payer: Self-pay | Admitting: Internal Medicine

## 2011-12-26 ENCOUNTER — Ambulatory Visit (INDEPENDENT_AMBULATORY_CARE_PROVIDER_SITE_OTHER): Payer: Commercial Managed Care - PPO | Admitting: Internal Medicine

## 2011-12-26 ENCOUNTER — Other Ambulatory Visit (INDEPENDENT_AMBULATORY_CARE_PROVIDER_SITE_OTHER): Payer: Commercial Managed Care - PPO

## 2011-12-26 VITALS — BP 130/88 | HR 68 | Temp 97.4°F | Resp 16 | Wt 211.0 lb

## 2011-12-26 DIAGNOSIS — IMO0001 Reserved for inherently not codable concepts without codable children: Secondary | ICD-10-CM

## 2011-12-26 DIAGNOSIS — I10 Essential (primary) hypertension: Secondary | ICD-10-CM

## 2011-12-26 DIAGNOSIS — N201 Calculus of ureter: Secondary | ICD-10-CM

## 2011-12-26 DIAGNOSIS — J301 Allergic rhinitis due to pollen: Secondary | ICD-10-CM

## 2011-12-26 LAB — LIPID PANEL
Cholesterol: 221 mg/dL — ABNORMAL HIGH (ref 0–200)
HDL: 49.6 mg/dL (ref >39.00)
Total CHOL/HDL Ratio: 4
Triglycerides: 77 mg/dL (ref 0.0–149.0)
VLDL: 15.4 mg/dL (ref 0.0–40.0)

## 2011-12-26 LAB — BASIC METABOLIC PANEL
BUN: 19 mg/dL (ref 6–23)
CO2: 30 mEq/L (ref 19–32)
Calcium: 9.6 mg/dL (ref 8.4–10.5)
Chloride: 103 mEq/L (ref 96–112)
Creatinine, Ser: 1.1 mg/dL (ref 0.4–1.5)
GFR: 76.25 mL/min (ref >60.00)
Glucose, Bld: 107 mg/dL — ABNORMAL HIGH (ref 70–99)
Potassium: 4.4 mEq/L (ref 3.5–5.1)
Sodium: 139 mEq/L (ref 135–145)

## 2011-12-26 LAB — HEPATIC FUNCTION PANEL
ALT: 27 U/L (ref 0–53)
AST: 26 U/L (ref 0–37)
Albumin: 4.5 g/dL (ref 3.5–5.2)
Alkaline Phosphatase: 36 U/L — ABNORMAL LOW (ref 39–117)
Bilirubin, Direct: 0.1 mg/dL (ref 0.0–0.3)
Total Bilirubin: 0.8 mg/dL (ref 0.3–1.2)
Total Protein: 7.2 g/dL (ref 6.0–8.3)

## 2011-12-26 LAB — HEMOGLOBIN A1C: Hgb A1c MFr Bld: 6.4 % (ref 4.6–6.5)

## 2011-12-26 LAB — LDL CHOLESTEROL, DIRECT: Direct LDL: 157.3 mg/dL

## 2011-12-26 MED ORDER — FLUTICASONE PROPIONATE 50 MCG/ACT NA SUSP: 2.0000 | Freq: Every day | NASAL | Status: DC

## 2011-12-26 NOTE — Progress Notes (Signed)
Subjective:    Patient ID: Luis Bentley, male    DOB: 1966-03-01, 46 y.o.   MRN: [id number]  HPI  F/u DM-2, new onset. Pt had elevated BS 281, and f/u a1c of 8.1 per Dr Grapey/urology.  Chart review shows glc 133 with labs feb 2012.  Pt denies chest pain, increased sob or doe, wheezing, orthopnea, PND, increased LE swelling, palpitations, dizziness or syncope.   Pt denies polydipsia, polyuria,.  Pt states overall good compliance with current meds, not really trying to follow lower cholesterol diet, wt overall up several lbs in the past yr and little exercise due to family and work.  Pt denies new neurological symptoms such as new headache, or facial or extremity weakness or numbness  Wife has had gestational DM so he well aware of how to check cbg's.    Past Medical History  Diagnosis Date  . Elevated blood pressure     few elevated readings-never used medication  . Type II or unspecified type diabetes mellitus without mention of complication, uncontrolled 11/14/2011  . Allergy    Past Surgical History  Procedure Date  . Elbow arthroscopy   . Ankle arthroscopy   . Cystoscopy/retrograde/ureteroscopy 04/03/2011    Procedure: CYSTOSCOPY/RETROGRADE/URETEROSCOPY;  Surgeon: Valetta Fuller, MD;  [facility]: [facility];  Service: Urology;  Laterality: Left;    reports that he has quit smoking. He quit smokeless tobacco use about 14 years ago. He reports that he drinks alcohol. His drug history not on file. family history includes Diabetes in his paternal aunt and paternal uncle and Heart disease in his maternal grandfather. No Known Allergies Current Outpatient Prescriptions on File Prior to Visit  Medication Sig Dispense Refill  . ACCU-CHEK FASTCLIX LANCETS MISC Inject 1 each as directed daily. Use as directed once daily to check blood sugar.  Diagnosis code 250.02  102 each  11  . cetirizine (ZYRTEC) 10 MG tablet Take 10 mg by mouth daily.        . DiphenhydrAMINE HCl (BENADRYL  ALLERGY PO) Take by mouth.        . fluticasone (FLONASE) 50 MCG/ACT nasal spray Place 2 sprays into the nose daily.  16 g  6  . glucose blood (ACCU-CHEK SMARTVIEW) test strip Use as directed once daily to check blood sugar.  Diagnosis code 250.02  100 each  11  . metFORMIN (GLUCOPHAGE) 500 MG tablet Take 1 tablet (500 mg total) by mouth 2 (two) times daily with a meal.  90 tablet  3  . Ketorolac Tromethamine (SPRIX NA) Place into the nose.        . Meth-Hyo-M Bl-Na Phos-Ph Sal (URIBEL) 118 MG CAPS Take 1 capsule (118 mg total) by mouth 3 (three) times daily as needed.  15 capsule  1   Review of Systems All otherwise neg per pt    Objective:   Physical Exam BP 130/88  Pulse 68  Temp 97.4 F (36.3 C) (Oral)  Resp 16  Wt 211 lb (95.709 kg) Physical Exam  VS noted Constitutional: Pt appears well-developed and well-nourished.  HENT: Head: Normocephalic.  Right Ear: External ear normal.  Left Ear: External ear normal.  Eyes: Conjunctivae and EOM are normal. Pupils are equal, round, and reactive to light.  Neck: Normal range of motion. Neck supple.  Cardiovascular: Normal rate and regular rhythm.   Pulmonary/Chest: Effort normal and breath sounds normal.  Neurological: Pt is alert..  Skin: Skin is warm. No erythema. No rash Psychiatric: Pt behavior is  normal. Thought content normal.     Assessment & Plan:

## 2011-12-26 NOTE — Telephone Encounter (Signed)
Done

## 2011-12-26 NOTE — Assessment & Plan Note (Addendum)
A1c was 8.1 6 wks ago Dr Isabel Caprice Continue with current prescription therapy as reflected on the Med list. Labs pending

## 2011-12-26 NOTE — Assessment & Plan Note (Signed)
No relapse 

## 2011-12-26 NOTE — Assessment & Plan Note (Signed)
Continue with current prescription therapy as reflected on the Med list.  

## 2011-12-26 NOTE — Assessment & Plan Note (Signed)
NAS diet 

## 2011-12-27 ENCOUNTER — Telehealth: Payer: Self-pay | Admitting: Internal Medicine

## 2011-12-27 NOTE — Telephone Encounter (Signed)
Pt informed/ copies mailed to pt 

## 2011-12-27 NOTE — Telephone Encounter (Signed)
Message copied by Tresa Garter on Fri Dec 27, 2011  7:42 AM ------      Message from: Corwin Levins      Created: Thu Dec 26, 2011  5:11 PM       Has appt soon      ----- Message -----         From: [facility] Interface         Sent: 12/26/2011   2:17 PM           To: Corwin Levins, MD

## 2011-12-27 NOTE — Telephone Encounter (Signed)
[name], please, inform patient that all labs are good Thx 

## 2011-12-27 NOTE — Telephone Encounter (Signed)
Message copied by Tresa Garter on Fri Dec 27, 2011  7:43 AM ------      Message from: Corwin Levins      Created: Thu Dec 26, 2011  5:11 PM       Has appt soon      ----- Message -----         From: [facility] In [facility] Interface         Sent: 12/26/2011   2:17 PM           To: Corwin Levins, MD

## 2012-01-01 ENCOUNTER — Other Ambulatory Visit: Payer: Self-pay | Admitting: *Deleted

## 2012-01-01 DIAGNOSIS — IMO0001 Reserved for inherently not codable concepts without codable children: Secondary | ICD-10-CM

## 2012-01-01 MED ORDER — METFORMIN HCL 500 MG PO TABS: 500.0000 mg | ORAL_TABLET | Freq: Two times a day (BID) | ORAL | Status: DC

## 2012-10-28 ENCOUNTER — Telehealth (INDEPENDENT_AMBULATORY_CARE_PROVIDER_SITE_OTHER): Payer: Self-pay

## 2012-10-28 ENCOUNTER — Ambulatory Visit (INDEPENDENT_AMBULATORY_CARE_PROVIDER_SITE_OTHER): Payer: Commercial Managed Care - PPO | Admitting: Surgery

## 2012-10-28 ENCOUNTER — Encounter (INDEPENDENT_AMBULATORY_CARE_PROVIDER_SITE_OTHER): Payer: Self-pay | Admitting: Surgery

## 2012-10-28 VITALS — BP 140/84 | HR 86 | Temp 98.0°F | Resp 16 | Ht 70.0 in | Wt 222.2 lb

## 2012-10-28 DIAGNOSIS — L0501 Pilonidal cyst with abscess: Secondary | ICD-10-CM

## 2012-10-28 MED ORDER — AMOXICILLIN-POT CLAVULANATE 875-125 MG PO TABS: 1.0000 | ORAL_TABLET | Freq: Two times a day (BID) | ORAL | Status: AC

## 2012-10-28 MED ORDER — HYDROCODONE-ACETAMINOPHEN 5-325 MG PO TABS: 1.0000 | ORAL_TABLET | ORAL | Status: AC | PRN

## 2012-10-28 NOTE — Progress Notes (Signed)
Chief Complaint:  Recurrent   History of Present Illness:  Luis Bentley is an 47 y.o. male comes in with history of recurrent pilonidal abscesses. He's had a lot of tenderness on the left side where his previously had polyp was abscesses drained. There is no much redness but there are 10 and is tender areas. I shaved the natal cleft and did not see a definite punctum with hair. I then did a rib block over the indurated area and using an 18-gauge needle saw contained pus. I ended up opening 2 areas in exploring in the area of induration. There was a lot of gross pus but there was a lot of dense cicatrix from his scar.  I will start him on Augmentin to take twice a day and some Vicodin for pain. He'll see [place] back in about 10 days  Past Medical History  Diagnosis Date  . Elevated blood pressure     few elevated readings-never used medication  . Type II or unspecified type diabetes mellitus without mention of complication, uncontrolled 11/14/2011  . Allergy     Past Surgical History  Procedure Laterality Date  . Elbow arthroscopy    . Ankle arthroscopy    . Cystoscopy/retrograde/ureteroscopy  04/03/2011    Procedure: CYSTOSCOPY/RETROGRADE/URETEROSCOPY;  Surgeon: Valetta Fuller, MD;  Location: [facility];  Service: Urology;  Laterality: Left;    Current Outpatient Prescriptions  Medication Sig Dispense Refill  . ACCU-CHEK FASTCLIX LANCETS MISC Inject 1 each as directed daily. Use as directed once daily to check blood sugar.  Diagnosis code 250.02  102 each  11  . cetirizine (ZYRTEC) 10 MG tablet Take 10 mg by mouth daily.        . DiphenhydrAMINE HCl (BENADRYL ALLERGY PO) Take by mouth.        . fluticasone (FLONASE) 50 MCG/ACT nasal spray Place 2 sprays into the nose daily.  16 g  6  . glucose blood (ACCU-CHEK SMARTVIEW) test strip Use as directed once daily to check blood sugar.  Diagnosis code 250.02  100 each  11  . Ketorolac Tromethamine (SPRIX NA) Place into the nose.         . metFORMIN (GLUCOPHAGE) 500 MG tablet Take 1 tablet (500 mg total) by mouth 2 (two) times daily with a meal.  180 tablet  3  . Meth-Hyo-M Bl-Na Phos-Ph Sal (URIBEL) 118 MG CAPS Take 1 capsule (118 mg total) by mouth 3 (three) times daily as needed.  15 capsule  1  . amoxicillin-clavulanate (AUGMENTIN) 875-125 MG per tablet Take 1 tablet by mouth 2 (two) times daily.  28 tablet  0   No current facility-administered medications for this visit.   Review of patient's allergies indicates no known allergies. Family History  Problem Relation Age of Onset  . Diabetes Paternal Aunt   . Diabetes Paternal Uncle   . Heart disease Maternal Grandfather    Social History:   reports that he has quit smoking. He quit smokeless tobacco use about 15 years ago. He reports that  drinks alcohol. His drug history is not on file.   REVIEW OF SYSTEMS - PERTINENT POSITIVES ONLY: noncontributory  Physical Exam:   Blood pressure 140/84, pulse 86, temperature 98 F (36.7 C), temperature source Temporal, resp. rate 16, height 5\' 10"  (1.778 m), weight 222 lb 3.2 oz (100.789 kg). Body mass index is 31.88 kg/(m^2).  Gen:  WDWN WM NAD  Neurological: Alert and oriented to person, place, and time. Motor and  sensory function is grossly intact  Head: Normocephalic and atraumatic.  Eyes: Conjunctivae are normal. Pupils are equal, round, and reactive to light. No scleral icterus.  Neck: Normal range of motion. Neck supple. No tracheal deviation or thyromegaly present.  Cardiovascular:  SR without murmurs or gallops.  No carotid bruits Respiratory: Effort normal.  No respiratory distress. No chest wall tenderness. Breath sounds normal.  No wheezes, rales or rhonchi.  Abdomen:  nontender Pilonidal on the left;  Recurrent with cicatrix.  Drained but not packed.   GU: Musculoskeletal: Normal range of motion. Extremities are nontender. No cyanosis, edema or clubbing noted Lymphadenopathy: No cervical, preauricular,  postauricular or axillary adenopathy is present Skin: Skin is warm and dry. No rash noted. No diaphoresis. No erythema. No pallor. Pscyh: Normal mood and affect. Behavior is normal. Judgment and thought content normal.   LABORATORY RESULTS: No results found for this or any previous visit (from the past 48 hour(s)).  RADIOLOGY RESULTS: No results found.  Problem List: Patient Active Problem List   Diagnosis Date Noted  . Pilonidal cyst with abscess-multirecurrent left 10/28/2012  . Type II or unspecified type diabetes mellitus without mention of complication, uncontrolled 11/14/2011  . Ureteral calculus, left 04/03/2011  . Gross hematuria 12/14/2010  . Palpitations 05/30/2010  . HYPERTENSION 01/03/2007  . HAY FEVER 01/03/2007    Assessment & Plan: Will treat with Vicodin and Augmentin.   Return 2 weeks    Matt B. Daphine Deutscher, MD, [facility] [phone] beeper [phone]  10/28/2012 5:38 PM

## 2012-10-28 NOTE — Telephone Encounter (Signed)
Luis Bentley with [facility] is asking for urg ov for today Patient with Pil Cyst in extreme pain. Appt today in urg with Dr. Daphine Deutscher. Will fax notes

## 2012-10-28 NOTE — Patient Instructions (Signed)
Pilonidal Cyst A pilonidal cyst occurs when hairs get trapped (ingrown) beneath the skin in the crease between the buttocks over your sacrum (the bone under that crease). Pilonidal cysts are most common in young men with a lot of body hair. When the cyst is ruptured (breaks) or leaking, fluid from the cyst may cause burning and itching. If the cyst becomes infected, it causes a painful swelling filled with pus (abscess). The pus and trapped hairs need to be removed (often by lancing) so that the infection can heal. However, recurrence is common and an operation may be needed to remove the cyst. HOME CARE INSTRUCTIONS   If the cyst was NOT INFECTED:  Keep the area clean and dry. Bathe or shower daily. Wash the area well with a germ-killing soap. Warm tub baths may help prevent infection and help with drainage. Dry the area well with a towel.  Avoid tight clothing to keep area as moisture free as possible.  Keep area between buttocks as free of hair as possible. A depilatory may be used.  If the cyst WAS INFECTED and needed to be drained:  Your caregiver packed the wound with gauze to keep the wound open. This allows the wound to heal from the inside outwards and continue draining.  Return for a wound check in 1 day or as suggested.  If you take tub baths or showers, repack the wound with gauze following them. Sponge baths (at the sink) are a good alternative.  If an antibiotic was ordered to fight the infection, take as directed.  Only take over-the-counter or prescription medicines for pain, discomfort, or fever as directed by your caregiver.  After the drain is removed, use sitz baths for 20 minutes 4 times per day. Clean the wound gently with mild unscented soap, pat dry, and then apply a dry dressing. SEEK MEDICAL CARE IF:   You have increased pain, swelling, redness, drainage, or bleeding from the area.  You have a fever.  You have muscles aches, dizziness, or a general ill  feeling. Document Released: 04/05/2000 Document Revised: 07/01/2011 Document Reviewed: 06/03/2008 Exit[employer] Patient Information 2014 [employer].  

## 2012-10-29 ENCOUNTER — Telehealth (INDEPENDENT_AMBULATORY_CARE_PROVIDER_SITE_OTHER): Payer: Self-pay

## 2012-10-29 NOTE — Telephone Encounter (Signed)
Pt calling in b/c he went to pick up his script last night of the Augmentin and it was not called in to the pharmacy. I told the pt that Dr Daphine Deutscher escribed the script for him last night. I advised the pt that I would call his pharmacy [facility] to see about the rx. I will call the pt back.

## 2012-10-29 NOTE — Telephone Encounter (Signed)
LMOM that I did call pt's rx to [facility] in [place] on [address].

## 2012-10-29 NOTE — Telephone Encounter (Signed)
I called the pharmacy to check on the script and the pharmacist stated they never received the e-file rx. I called the Augmentin 875mg  #28 to take BID daily with no refills to [facility] at [phone] per Dr Daphine Deutscher.

## 2012-11-18 ENCOUNTER — Encounter (INDEPENDENT_AMBULATORY_CARE_PROVIDER_SITE_OTHER): Payer: Self-pay

## 2012-11-19 ENCOUNTER — Encounter (INDEPENDENT_AMBULATORY_CARE_PROVIDER_SITE_OTHER): Payer: Commercial Managed Care - PPO | Admitting: Surgery

## 2012-11-30 ENCOUNTER — Other Ambulatory Visit: Payer: Self-pay | Admitting: Internal Medicine

## 2013-02-09 ENCOUNTER — Other Ambulatory Visit: Payer: Self-pay | Admitting: Internal Medicine

## 2016-02-06 DIAGNOSIS — E119 Type 2 diabetes mellitus without complications: Secondary | ICD-10-CM | POA: Diagnosis not present

## 2016-02-06 DIAGNOSIS — E291 Testicular hypofunction: Secondary | ICD-10-CM | POA: Diagnosis not present

## 2016-02-06 DIAGNOSIS — Z0000 Encounter for general adult medical examination without abnormal findings: Secondary | ICD-10-CM | POA: Diagnosis not present

## 2016-02-06 DIAGNOSIS — Z125 Encounter for screening for malignant neoplasm of prostate: Secondary | ICD-10-CM | POA: Diagnosis not present

## 2016-02-09 DIAGNOSIS — Z23 Encounter for immunization: Secondary | ICD-10-CM | POA: Diagnosis not present

## 2016-02-09 DIAGNOSIS — Z1389 Encounter for screening for other disorder: Secondary | ICD-10-CM | POA: Diagnosis not present

## 2016-02-09 DIAGNOSIS — L723 Sebaceous cyst: Secondary | ICD-10-CM | POA: Diagnosis not present

## 2016-02-09 DIAGNOSIS — Z0000 Encounter for general adult medical examination without abnormal findings: Secondary | ICD-10-CM | POA: Diagnosis not present

## 2016-02-09 DIAGNOSIS — E784 Other hyperlipidemia: Secondary | ICD-10-CM | POA: Diagnosis not present

## 2016-02-09 DIAGNOSIS — M25552 Pain in left hip: Secondary | ICD-10-CM | POA: Diagnosis not present

## 2016-02-09 DIAGNOSIS — E298 Other testicular dysfunction: Secondary | ICD-10-CM | POA: Diagnosis not present

## 2016-03-06 ENCOUNTER — Encounter (INDEPENDENT_AMBULATORY_CARE_PROVIDER_SITE_OTHER): Payer: Self-pay | Admitting: Orthopedic Surgery

## 2016-03-06 ENCOUNTER — Ambulatory Visit (INDEPENDENT_AMBULATORY_CARE_PROVIDER_SITE_OTHER): Payer: Self-pay

## 2016-03-06 ENCOUNTER — Ambulatory Visit (INDEPENDENT_AMBULATORY_CARE_PROVIDER_SITE_OTHER): Payer: BLUE CROSS/BLUE SHIELD | Admitting: Orthopedic Surgery

## 2016-03-06 VITALS — BP 138/102 | HR 78 | Resp 16 | Ht 69.0 in | Wt 219.0 lb

## 2016-03-06 DIAGNOSIS — M25552 Pain in left hip: Secondary | ICD-10-CM | POA: Diagnosis not present

## 2016-03-06 NOTE — Progress Notes (Signed)
Office Visit Note   Patient: Luis Bentley           Date of Birth: 08/20/1965           MRN: [id number] Visit Date: 03/06/2016              Requested by: Tresa GarterAleksei V Plotnikov, MD [address] [place], [place] [address] PCP: Ezequiel KayserPERINI,MARK A, MD   Assessment & Plan: Visit Diagnoses:  1. Pain of left hip joint   2. Pain in left hip     Plan: #1: MRI arthrogram of the left hip  #2: Low back up  Follow-Up Instructions: Return after scan.   Orders:  Orders Placed This Encounter  Procedures  . XR HIP UNILAT W OR W/O PELVIS 1V LEFT  . MR Hip Left w/ contrast  . Arthrogram   No orders of the defined types were placed in this encounter.     Procedures: No procedures performed   Clinical Data: No additional findings.   Subjective: Chief Complaint  Patient presents with  . Left Hip - Pain    Luis Bentley is a very pleasant 50 year old white male who is seen today for evaluation of his left hip. He's had problems with left hip for the past 3 years. He states that he has pain in the groin area especially after sitting for long periods of time. Also now noting some nighttime pain. He has worsening pain especially after walking about 10-15 minutes and then he must stop because of pain in the groin area. Any recent history of injury or trauma. Denies any radicular type symptoms. He is a diabetic.    Review of Systems  Constitutional: Negative.   HENT: Negative.   Respiratory: Negative.   Cardiovascular: Negative.   Gastrointestinal: Negative.   Endocrine:       Insulin-dependent diabetic  Genitourinary: Negative.   Skin: Negative.   Neurological: Negative.   Hematological: Negative.   Psychiatric/Behavioral: Negative.      Objective: Vital Signs: BP (!) 138/102   Pulse 78   Resp 16   Ht 5\' 9"  (1.753 m)   Wt 219 lb (99.3 kg)   BMI 32.34 kg/m   Physical Exam  Constitutional: He is oriented to person, place, and time. He appears well-developed and well-nourished.    HENT:  Head: Normocephalic and atraumatic.  Eyes: EOM are normal. Pupils are equal, round, and reactive to light.  Neck:  No carotid bruits  Cardiovascular: Normal rate.   Pulmonary/Chest: Effort normal.  Neurological: He is alert and oriented to person, place, and time.  Skin: Skin is warm and dry.  Psychiatric: He has a normal mood and affect. His behavior is normal. Judgment and thought content normal.    Left Hip Exam   Range of Motion  Flexion: 110  Internal Rotation: 15  External Rotation: 30  Abduction: 35   Muscle Strength  The patient has normal left hip strength.   Other  Sensation: normal Pulse: present      Specialty Comments:  No specialty comments available.  Imaging: Xr Hip Unilat W Or W/o Pelvis 1v Left  Result Date: 03/06/2016 AP pelvis and 2 view left hip reveals calcification at the superior lateral acetabulum. There is also approximately 25-30% uncoverage of the femoral head. Really there appears to be loss of articular cartilage with narrowing of the joint space.    PMFS History: Patient Active Problem List   Diagnosis Date Noted  . Pilonidal cyst with abscess-multirecurrent left 10/28/2012  .  Type II or unspecified type diabetes mellitus without mention of complication, uncontrolled 11/14/2011  . Ureteral calculus, left 04/03/2011  . Gross hematuria 12/14/2010  . Palpitations 05/30/2010  . HYPERTENSION 01/03/2007  . HAY FEVER 01/03/2007   Past Medical History:  Diagnosis Date  . Allergy   . Elevated blood pressure    few elevated readings-never used medication  . Type II or unspecified type diabetes mellitus without mention of complication, uncontrolled 11/14/2011    Family History  Problem Relation Age of Onset  . Diabetes Paternal Aunt   . Diabetes Paternal Uncle   . Heart disease Maternal Grandfather     Past Surgical History:  Procedure Laterality Date  . ANKLE ARTHROSCOPY    . CYSTOSCOPY/RETROGRADE/URETEROSCOPY   04/03/2011   Procedure: CYSTOSCOPY/RETROGRADE/URETEROSCOPY;  Surgeon: Valetta Fulleravid S Grapey, MD;  [facility];  Service: Urology;  Laterality: Left;  . ELBOW ARTHROSCOPY     Social History   Occupational History  . Emergency planning/management officerroject manager    Social History Main Topics  . Smoking status: Former Games developermoker  . Smokeless tobacco: Former NeurosurgeonUser    Quit date: 03/31/1997  . Alcohol use Yes     Comment: rare  . Drug use: Unknown  . Sexual activity: Not on file

## 2016-03-27 ENCOUNTER — Ambulatory Visit
Admission: RE | Admit: 2016-03-27 | Discharge: 2016-03-27 | Disposition: A | Discharge status: Home / Self Care | Payer: BLUE CROSS/BLUE SHIELD | Source: Ambulatory Visit | Attending: Orthopedic Surgery | Admitting: Orthopedic Surgery

## 2016-03-27 DIAGNOSIS — S76012A Strain of muscle, fascia and tendon of left hip, initial encounter: Secondary | ICD-10-CM | POA: Diagnosis not present

## 2016-03-27 DIAGNOSIS — M25552 Pain in left hip: Secondary | ICD-10-CM

## 2016-03-27 MED ORDER — IOPAMIDOL (ISOVUE-M 200) INJECTION 41%
15.0000 mL | Freq: Once | INTRAMUSCULAR | Status: AC
  Administered 2016-03-27: 15 mL via INTRA_ARTICULAR

## 2016-03-29 ENCOUNTER — Encounter (INDEPENDENT_AMBULATORY_CARE_PROVIDER_SITE_OTHER): Payer: Self-pay | Admitting: Orthopaedic Surgery

## 2016-03-29 ENCOUNTER — Other Ambulatory Visit (INDEPENDENT_AMBULATORY_CARE_PROVIDER_SITE_OTHER): Payer: Self-pay

## 2016-03-29 ENCOUNTER — Ambulatory Visit (INDEPENDENT_AMBULATORY_CARE_PROVIDER_SITE_OTHER): Payer: BLUE CROSS/BLUE SHIELD | Admitting: Orthopaedic Surgery

## 2016-03-29 VITALS — BP 134/84 | HR 70 | Ht 69.0 in | Wt 212.0 lb

## 2016-03-29 DIAGNOSIS — M25551 Pain in right hip: Secondary | ICD-10-CM

## 2016-03-29 DIAGNOSIS — M25552 Pain in left hip: Secondary | ICD-10-CM | POA: Diagnosis not present

## 2016-03-29 NOTE — Progress Notes (Signed)
   Office Visit Note   Patient: Luis Bentley           Date of Birth: 03/06/1966           MRN: [id number] Visit Date: 03/29/2016              Requested by: Rodrigo RanMark Perini, MD [address] [place], [place] [address] PCP: Ezequiel KayserPERINI,MARK A, MD   Assessment & Plan: Visit Diagnoses: Chronic left hip pain. 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  Plan: Consult Dr. Alvester MorinNewton for left hip injection. We had a long discussion regarding the MRI scan findings. We'll start with a hip injection and consider hip arthroscopy depending upon his response  Follow-Up Instructions: No Follow-up on file.   Orders:  No orders of the defined types were placed in this encounter.  No orders of the defined types were placed in this encounter.     Procedures: No procedures performed   Clinical Data: No additional findings. MRI scan demonstrates a tear of the anterolateral labrum associated with diffuse thinning of articular cartilage on the femoral head. Subjective: No chief complaint on file.   Pt here for MRI results   Onalee HuaDavid was recently seen by Arlys JohnBrian for follow-up evaluation of his chronic left hip pain. Onalee HuaDavid relates he has had occasional pain for at least 3 years without history of injury or trauma. Sometimes it related to the weather. Oftentimes its related to activity. Pain seems to be localized directly in the left groin without radicular discomfort. Review of Systems   Objective: Vital Signs: Ht 5\' 9"  (1.753 m)   Wt 212 lb (96.2 kg)   BMI 31.31 kg/m   Physical Exam  Ortho Exam no significant tenderness with internal and external rotation of the left hip. There was no pain today with flexion of the hip with internal/external rotation. Straight leg raise is negative bilaterally reflexes were symmetrical neurovascular exam was intact. No limp  Specialty Comments:  No specialty comments available.  Imaging: No  results found.   PMFS History: Patient Active Problem List   Diagnosis Date Noted  . Pilonidal cyst with abscess-multirecurrent left 10/28/2012  . Type II or unspecified type diabetes mellitus without mention of complication, uncontrolled 11/14/2011  . Ureteral calculus, left 04/03/2011  . Gross hematuria 12/14/2010  . Palpitations 05/30/2010  . HYPERTENSION 01/03/2007  . HAY FEVER 01/03/2007   Past Medical History:  Diagnosis Date  . Allergy   . Elevated blood pressure    few elevated readings-never used medication  . Type II or unspecified type diabetes mellitus without mention of complication, uncontrolled 11/14/2011    Family History  Problem Relation Age of Onset  . Diabetes Paternal Aunt   . Diabetes Paternal Uncle   . Heart disease Maternal Grandfather     Past Surgical History:  Procedure Laterality Date  . ANKLE ARTHROSCOPY    . CYSTOSCOPY/RETROGRADE/URETEROSCOPY  04/03/2011   Procedure: CYSTOSCOPY/RETROGRADE/URETEROSCOPY;  Surgeon: Valetta Fulleravid S Grapey, MD;  [facility] ;  Service: Urology;  Laterality: Left;  . ELBOW ARTHROSCOPY     Social History   Occupational History  . Emergency planning/management officerroject manager    Social History Main Topics  . Smoking status: Former Games developermoker  . Smokeless tobacco: Former NeurosurgeonUser    Quit date: 03/31/1997  . Alcohol use Yes     Comment: rare  . Drug use: Unknown  . Sexual activity: Not on file

## 2016-04-09 ENCOUNTER — Encounter (INDEPENDENT_AMBULATORY_CARE_PROVIDER_SITE_OTHER): Payer: Self-pay | Admitting: Physical Medicine and Rehabilitation

## 2016-04-09 ENCOUNTER — Ambulatory Visit (INDEPENDENT_AMBULATORY_CARE_PROVIDER_SITE_OTHER): Payer: BLUE CROSS/BLUE SHIELD | Admitting: Physical Medicine and Rehabilitation

## 2016-04-09 VITALS — BP 147/96

## 2016-04-09 DIAGNOSIS — M25552 Pain in left hip: Secondary | ICD-10-CM | POA: Diagnosis not present

## 2016-04-09 NOTE — Progress Notes (Signed)
Luis SheerDavid D Bentley - 50 y.o. male MRN [id number]  Date of birth: 11/19/1965  Office Visit Note: Visit Date: 04/09/2016 PCP: Ezequiel KayserPERINI,MARK A, MD Referred by: Rodrigo RanPerini, Mark, MD  Subjective: Chief Complaint  Patient presents with  . Left Hip - Pain   HPI: Luis Bentley is a 50 year old gentleman who complains of chronic worsening Left hip and groin pain for around 3 years. He reports that the pain is. "not constant but very frequent". He is having an MRI arthrogram showing labral tear. He has not had therapeutic injections or surgery. He's had no specific trauma but he has told me that he has had years of activities with sports and rodeo and working.    ROS Otherwise per HPI.  Assessment & Plan: Visit Diagnoses:  1. Pain in left hip     Plan: Findings:  Plan right hip anesthetic arthrogram diagnostically and therapeutically. I did report that he felt some difference in the anesthetic phase of the injection. It is however difficult to judge as his real pain is somewhat sporadic and not specifically reproducible.    Meds & Orders: No orders of the defined types were placed in this encounter.   Orders Placed This Encounter  Procedures  . Large Joint Injection/Arthrocentesis    Follow-up: Return if symptoms worsen or fail to improve after two weeks, for Recheck hip with Dr. Cleophas DunkerWhitfield.   Procedures: Hip anesthetic arthrogram Date/Time: 04/09/2016 3:27 PM Performed by: Tyrell AntonioNEWTON, Breonia Kirstein Authorized by: Tyrell AntonioNEWTON, Isley Zinni   Consent Given by:  Patient Site marked: the procedure site was marked   Timeout: prior to procedure the correct patient, procedure, and site was verified   Indications:  Pain and diagnostic evaluation Location:  Hip Site:  L hip joint Prep: patient was prepped and draped in usual sterile fashion   Needle Size:  22 G Needle length (in): 5.0 inches. Approach:  Anterior Ultrasound Guidance: No   Fluoroscopic Guidance: No   Arthrogram: Yes   Medications:  4 mL lidocaine 2  %; 80 mg triamcinolone acetonide 40 MG/ML Aspiration Attempted: Yes   Patient tolerance:  Patient tolerated the procedure well with no immediate complications  Arthrogram demonstrated excellent flow of contrast throughout the joint surface without extravasation or obvious defect.  The patient had relief of symptoms during the anesthetic phase of the injection.     No notes on file   Clinical History: No specialty comments available.  He reports that he has quit smoking. He quit smokeless tobacco use about 19 years ago. No results for input(s): HGBA1C, LABURIC in the last 8760 hours.  Objective:  VS:  HT:    WT:   BMI:     BP:(!) 147/96  HR: bpm  TEMP: ( )  RESP:  Physical Exam  Ortho Exam Imaging: No results found.  Past Medical/Family/Surgical/Social History: Medications & Allergies reviewed per EMR Patient Active Problem List   Diagnosis Date Noted  . Pilonidal cyst with abscess-multirecurrent left 10/28/2012  . Type II or unspecified type diabetes mellitus without mention of complication, uncontrolled 11/14/2011  . Ureteral calculus, left 04/03/2011  . Gross hematuria 12/14/2010  . Palpitations 05/30/2010  . HYPERTENSION 01/03/2007  . HAY FEVER 01/03/2007   Past Medical History:  Diagnosis Date  . Allergy   . Elevated blood pressure    few elevated readings-never used medication  . Type II or unspecified type diabetes mellitus without mention of complication, uncontrolled 11/14/2011   Family History  Problem Relation Age of Onset  . Diabetes Paternal  Aunt   . Diabetes Paternal Uncle   . Heart disease Maternal Grandfather    Past Surgical History:  Procedure Laterality Date  . ANKLE ARTHROSCOPY    . CYSTOSCOPY/RETROGRADE/URETEROSCOPY  04/03/2011   Procedure: CYSTOSCOPY/RETROGRADE/URETEROSCOPY;  Surgeon: Valetta Fulleravid S Grapey, MD;  [facility] [place];  Service: Urology;  Laterality: Left;  . ELBOW ARTHROSCOPY     Social History   Occupational  History  . Emergency planning/management officerroject manager    Social History Main Topics  . Smoking status: Former Games developermoker  . Smokeless tobacco: Former NeurosurgeonUser    Quit date: 03/31/1997  . Alcohol use Yes     Comment: rare  . Drug use: Unknown  . Sexual activity: Not on file

## 2016-04-09 NOTE — Patient Instructions (Signed)

## 2016-04-10 MED ORDER — TRIAMCINOLONE ACETONIDE 40 MG/ML IJ SUSP
80.0000 mg | INTRAMUSCULAR | Status: AC | PRN
  Administered 2016-04-09: 80 mg via INTRA_ARTICULAR

## 2016-04-10 MED ORDER — LIDOCAINE HCL 2 % IJ SOLN
4.0000 mL | INTRAMUSCULAR | Status: AC | PRN
  Administered 2016-04-09: 4 mL

## 2016-05-23 DIAGNOSIS — E119 Type 2 diabetes mellitus without complications: Secondary | ICD-10-CM | POA: Diagnosis not present

## 2016-05-23 DIAGNOSIS — Z1389 Encounter for screening for other disorder: Secondary | ICD-10-CM | POA: Diagnosis not present

## 2016-05-23 DIAGNOSIS — M25552 Pain in left hip: Secondary | ICD-10-CM | POA: Diagnosis not present

## 2016-05-23 DIAGNOSIS — R030 Elevated blood-pressure reading, without diagnosis of hypertension: Secondary | ICD-10-CM | POA: Diagnosis not present

## 2016-08-21 DIAGNOSIS — Z6832 Body mass index (BMI) 32.0-32.9, adult: Secondary | ICD-10-CM | POA: Diagnosis not present

## 2016-08-21 DIAGNOSIS — E119 Type 2 diabetes mellitus without complications: Secondary | ICD-10-CM | POA: Diagnosis not present

## 2016-08-21 DIAGNOSIS — R030 Elevated blood-pressure reading, without diagnosis of hypertension: Secondary | ICD-10-CM | POA: Diagnosis not present

## 2016-08-21 DIAGNOSIS — E784 Other hyperlipidemia: Secondary | ICD-10-CM | POA: Diagnosis not present

## 2016-08-29 ENCOUNTER — Encounter (INDEPENDENT_AMBULATORY_CARE_PROVIDER_SITE_OTHER): Payer: Self-pay | Admitting: Orthopaedic Surgery

## 2016-08-29 ENCOUNTER — Ambulatory Visit (INDEPENDENT_AMBULATORY_CARE_PROVIDER_SITE_OTHER): Payer: BLUE CROSS/BLUE SHIELD | Admitting: Orthopaedic Surgery

## 2016-08-29 ENCOUNTER — Ambulatory Visit (INDEPENDENT_AMBULATORY_CARE_PROVIDER_SITE_OTHER): Payer: Self-pay

## 2016-08-29 VITALS — BP 145/94 | HR 99 | Resp 14

## 2016-08-29 DIAGNOSIS — M25552 Pain in left hip: Secondary | ICD-10-CM | POA: Diagnosis not present

## 2016-08-29 NOTE — Progress Notes (Signed)
Office Visit Note   Patient: Luis Bentley           Date of Birth: Jun 21, 1965           MRN: [id number] Visit Date: 08/29/2016              Requested by: Rodrigo Ran, MD [address] [place], [place] [address] PCP: Rodrigo Ran, MD   Assessment & Plan: Visit Diagnoses:  1. Pain of left hip joint   Jojo has by prior MRI scan evidence of an anterior labral tear of his left hip. 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  Plan: I think it's worth having Dr."Stubbs at [facility] evaluate him for consideration of an arthroscopic procedure. I'm not so sure that he's a candidate for hip replacement given the very localized area of chondromalacia.  Follow-Up Instructions: Return will schedule appt at [facility].   Orders:  Orders Placed This Encounter  Procedures  . Ambulatory referral to Orthopedic Surgery   No orders of the defined types were placed in this encounter.     Procedures: No procedures performed   Clinical Data: No additional findings.   Subjective: Chief Complaint  Patient presents with  . Left Hip - Pain  . Follow-up    left hip injection 04/09/16 with Dr. Alvester Morin, injection help about 1 month. pain has slowly increased in left hip to today. shooting pain from left hip joint radiating down left leg.  ibuprofen helps some. pain is now waking him up at night.  Fabien had a cortisone injection in his left hip with Dr. Alvester Morin with resolution of his pain for least a month. Since that the pain has recurred to the point that he is having some trouble trying to find a comfortable position sleeping at night. He does walk with a limp when he first gets up from a sitting position. It's become a nuisance  HPI  Review of Systems   Objective: Vital Signs: BP (!) 145/94 (BP Location: Right Arm, Patient Position: Sitting, Cuff Size: Normal)   Pulse 99   Resp 14   Physical Exam  Ortho Exam pain on extremes of internal/external  rotation of left hip. There is some loss of motion compared to the right side. Straight leg raise negative bilaterally reflexes symmetrical. Motor and sensory exam intact.  Specialty Comments:  No specialty comments available.  Imaging: No results found.   PMFS History: Patient Active Problem List   Diagnosis Date Noted  . Pilonidal cyst with abscess-multirecurrent left 10/28/2012  . Type II or unspecified type diabetes mellitus without mention of complication, uncontrolled 11/14/2011  . Ureteral calculus, left 04/03/2011  . Gross hematuria 12/14/2010  . Palpitations 05/30/2010  . HYPERTENSION 01/03/2007  . HAY FEVER 01/03/2007   Past Medical History:  Diagnosis Date  . Allergy   . Elevated blood pressure    few elevated readings-never used medication  . Type II or unspecified type diabetes mellitus without mention of complication, uncontrolled 11/14/2011    Family History  Problem Relation Age of Onset  . Diabetes Paternal Aunt   . Diabetes Paternal Uncle   . Heart disease Maternal Grandfather     Past Surgical History:  Procedure Laterality Date  . ANKLE ARTHROSCOPY    . CYSTOSCOPY/RETROGRADE/URETEROSCOPY  04/03/2011   Procedure: CYSTOSCOPY/RETROGRADE/URETEROSCOPY;  Surgeon: Valetta Fuller, MD;  Location: [facility];  Service: Urology;  Laterality: Left;  . ELBOW ARTHROSCOPY     Social History  Occupational History  . Emergency planning/management officerroject manager    Social History Main Topics  . Smoking status: Former Games developermoker  . Smokeless tobacco: Former NeurosurgeonUser    Quit date: 03/31/1997  . Alcohol use Yes     Comment: rare  . Drug use: Unknown  . Sexual activity: Not on file     Valeria BatmanPeter W Whitfield, MD   Note - This record has been created using [employer] software.  Chart creation errors have been sought, but may not always  have been located. Such creation errors do not reflect on  the standard of medical care.

## 2016-10-07 DIAGNOSIS — M25552 Pain in left hip: Secondary | ICD-10-CM | POA: Diagnosis not present

## 2016-10-07 DIAGNOSIS — M1632 Unilateral osteoarthritis resulting from hip dysplasia, left hip: Secondary | ICD-10-CM | POA: Diagnosis not present

## 2016-10-11 DIAGNOSIS — M1610 Unilateral primary osteoarthritis, unspecified hip: Secondary | ICD-10-CM | POA: Diagnosis not present

## 2016-10-15 ENCOUNTER — Telehealth (INDEPENDENT_AMBULATORY_CARE_PROVIDER_SITE_OTHER): Payer: Self-pay | Admitting: Orthopaedic Surgery

## 2016-10-15 NOTE — Telephone Encounter (Signed)
RECORDS FAXED TO [facility] [phone]

## 2016-10-21 DIAGNOSIS — M1612 Unilateral primary osteoarthritis, left hip: Secondary | ICD-10-CM | POA: Diagnosis not present

## 2016-10-21 DIAGNOSIS — M6281 Muscle weakness (generalized): Secondary | ICD-10-CM | POA: Diagnosis not present

## 2016-10-21 DIAGNOSIS — M25652 Stiffness of left hip, not elsewhere classified: Secondary | ICD-10-CM | POA: Diagnosis not present

## 2016-10-21 DIAGNOSIS — M25552 Pain in left hip: Secondary | ICD-10-CM | POA: Diagnosis not present

## 2016-10-25 DIAGNOSIS — M25652 Stiffness of left hip, not elsewhere classified: Secondary | ICD-10-CM | POA: Diagnosis not present

## 2016-10-25 DIAGNOSIS — M1612 Unilateral primary osteoarthritis, left hip: Secondary | ICD-10-CM | POA: Diagnosis not present

## 2016-10-25 DIAGNOSIS — M25552 Pain in left hip: Secondary | ICD-10-CM | POA: Diagnosis not present

## 2016-10-25 DIAGNOSIS — M6281 Muscle weakness (generalized): Secondary | ICD-10-CM | POA: Diagnosis not present

## 2016-10-30 DIAGNOSIS — M25552 Pain in left hip: Secondary | ICD-10-CM | POA: Diagnosis not present

## 2016-10-30 DIAGNOSIS — M6281 Muscle weakness (generalized): Secondary | ICD-10-CM | POA: Diagnosis not present

## 2016-10-30 DIAGNOSIS — M1612 Unilateral primary osteoarthritis, left hip: Secondary | ICD-10-CM | POA: Diagnosis not present

## 2016-10-30 DIAGNOSIS — M25652 Stiffness of left hip, not elsewhere classified: Secondary | ICD-10-CM | POA: Diagnosis not present

## 2016-11-12 DIAGNOSIS — M25652 Stiffness of left hip, not elsewhere classified: Secondary | ICD-10-CM | POA: Diagnosis not present

## 2016-11-12 DIAGNOSIS — M25552 Pain in left hip: Secondary | ICD-10-CM | POA: Diagnosis not present

## 2016-11-12 DIAGNOSIS — M1612 Unilateral primary osteoarthritis, left hip: Secondary | ICD-10-CM | POA: Diagnosis not present

## 2016-11-12 DIAGNOSIS — M6281 Muscle weakness (generalized): Secondary | ICD-10-CM | POA: Diagnosis not present

## 2016-11-15 DIAGNOSIS — M25552 Pain in left hip: Secondary | ICD-10-CM | POA: Diagnosis not present

## 2016-11-15 DIAGNOSIS — M6281 Muscle weakness (generalized): Secondary | ICD-10-CM | POA: Diagnosis not present

## 2016-11-15 DIAGNOSIS — M25652 Stiffness of left hip, not elsewhere classified: Secondary | ICD-10-CM | POA: Diagnosis not present

## 2016-11-15 DIAGNOSIS — M1612 Unilateral primary osteoarthritis, left hip: Secondary | ICD-10-CM | POA: Diagnosis not present

## 2016-11-20 DIAGNOSIS — M1612 Unilateral primary osteoarthritis, left hip: Secondary | ICD-10-CM | POA: Diagnosis not present

## 2016-11-20 DIAGNOSIS — M25652 Stiffness of left hip, not elsewhere classified: Secondary | ICD-10-CM | POA: Diagnosis not present

## 2016-11-20 DIAGNOSIS — M6281 Muscle weakness (generalized): Secondary | ICD-10-CM | POA: Diagnosis not present

## 2016-11-20 DIAGNOSIS — M25552 Pain in left hip: Secondary | ICD-10-CM | POA: Diagnosis not present

## 2016-11-22 DIAGNOSIS — M1612 Unilateral primary osteoarthritis, left hip: Secondary | ICD-10-CM | POA: Diagnosis not present

## 2016-11-22 DIAGNOSIS — M25552 Pain in left hip: Secondary | ICD-10-CM | POA: Diagnosis not present

## 2016-11-22 DIAGNOSIS — M25652 Stiffness of left hip, not elsewhere classified: Secondary | ICD-10-CM | POA: Diagnosis not present

## 2016-11-22 DIAGNOSIS — M6281 Muscle weakness (generalized): Secondary | ICD-10-CM | POA: Diagnosis not present

## 2016-12-20 DIAGNOSIS — M1610 Unilateral primary osteoarthritis, unspecified hip: Secondary | ICD-10-CM | POA: Diagnosis not present

## 2016-12-20 DIAGNOSIS — M25552 Pain in left hip: Secondary | ICD-10-CM | POA: Diagnosis not present

## 2017-01-28 DIAGNOSIS — M1612 Unilateral primary osteoarthritis, left hip: Secondary | ICD-10-CM | POA: Insufficient documentation

## 2017-02-04 DIAGNOSIS — E119 Type 2 diabetes mellitus without complications: Secondary | ICD-10-CM | POA: Diagnosis not present

## 2017-02-04 DIAGNOSIS — Z0000 Encounter for general adult medical examination without abnormal findings: Secondary | ICD-10-CM | POA: Diagnosis not present

## 2017-02-04 DIAGNOSIS — Z125 Encounter for screening for malignant neoplasm of prostate: Secondary | ICD-10-CM | POA: Diagnosis not present

## 2017-02-04 DIAGNOSIS — Z79899 Other long term (current) drug therapy: Secondary | ICD-10-CM | POA: Diagnosis not present

## 2017-02-04 DIAGNOSIS — R8290 Unspecified abnormal findings in urine: Secondary | ICD-10-CM | POA: Diagnosis not present

## 2017-02-11 DIAGNOSIS — Z1389 Encounter for screening for other disorder: Secondary | ICD-10-CM | POA: Diagnosis not present

## 2017-02-11 DIAGNOSIS — E298 Other testicular dysfunction: Secondary | ICD-10-CM | POA: Diagnosis not present

## 2017-02-11 DIAGNOSIS — E7849 Other hyperlipidemia: Secondary | ICD-10-CM | POA: Diagnosis not present

## 2017-02-11 DIAGNOSIS — Z23 Encounter for immunization: Secondary | ICD-10-CM | POA: Diagnosis not present

## 2017-02-11 DIAGNOSIS — M25552 Pain in left hip: Secondary | ICD-10-CM | POA: Diagnosis not present

## 2017-02-11 DIAGNOSIS — R030 Elevated blood-pressure reading, without diagnosis of hypertension: Secondary | ICD-10-CM | POA: Diagnosis not present

## 2017-02-11 DIAGNOSIS — Z0000 Encounter for general adult medical examination without abnormal findings: Secondary | ICD-10-CM | POA: Diagnosis not present

## 2017-02-17 DIAGNOSIS — Z01818 Encounter for other preprocedural examination: Secondary | ICD-10-CM | POA: Diagnosis not present

## 2017-02-17 DIAGNOSIS — M1612 Unilateral primary osteoarthritis, left hip: Secondary | ICD-10-CM | POA: Diagnosis not present

## 2017-02-24 DIAGNOSIS — Z1212 Encounter for screening for malignant neoplasm of rectum: Secondary | ICD-10-CM | POA: Diagnosis not present

## 2017-03-10 DIAGNOSIS — M1632 Unilateral osteoarthritis resulting from hip dysplasia, left hip: Secondary | ICD-10-CM | POA: Diagnosis not present

## 2017-03-10 DIAGNOSIS — M1612 Unilateral primary osteoarthritis, left hip: Secondary | ICD-10-CM | POA: Diagnosis not present

## 2017-03-10 DIAGNOSIS — Z7984 Long term (current) use of oral hypoglycemic drugs: Secondary | ICD-10-CM | POA: Diagnosis not present

## 2017-03-10 DIAGNOSIS — E785 Hyperlipidemia, unspecified: Secondary | ICD-10-CM | POA: Diagnosis not present

## 2017-03-10 DIAGNOSIS — Z96642 Presence of left artificial hip joint: Secondary | ICD-10-CM | POA: Diagnosis not present

## 2017-03-10 DIAGNOSIS — Z79899 Other long term (current) drug therapy: Secondary | ICD-10-CM | POA: Diagnosis not present

## 2017-03-10 DIAGNOSIS — Z6830 Body mass index (BMI) 30.0-30.9, adult: Secondary | ICD-10-CM | POA: Diagnosis not present

## 2017-03-10 DIAGNOSIS — E669 Obesity, unspecified: Secondary | ICD-10-CM | POA: Diagnosis not present

## 2017-03-10 DIAGNOSIS — I10 Essential (primary) hypertension: Secondary | ICD-10-CM | POA: Diagnosis not present

## 2017-03-10 DIAGNOSIS — E119 Type 2 diabetes mellitus without complications: Secondary | ICD-10-CM | POA: Diagnosis not present

## 2017-03-10 DIAGNOSIS — Z471 Aftercare following joint replacement surgery: Secondary | ICD-10-CM | POA: Diagnosis not present

## 2017-03-11 DIAGNOSIS — Z96642 Presence of left artificial hip joint: Secondary | ICD-10-CM | POA: Insufficient documentation

## 2017-03-12 DIAGNOSIS — Z87442 Personal history of urinary calculi: Secondary | ICD-10-CM | POA: Diagnosis not present

## 2017-03-12 DIAGNOSIS — Z471 Aftercare following joint replacement surgery: Secondary | ICD-10-CM | POA: Diagnosis not present

## 2017-03-12 DIAGNOSIS — M1990 Unspecified osteoarthritis, unspecified site: Secondary | ICD-10-CM | POA: Diagnosis not present

## 2017-03-12 DIAGNOSIS — Z7984 Long term (current) use of oral hypoglycemic drugs: Secondary | ICD-10-CM | POA: Diagnosis not present

## 2017-03-12 DIAGNOSIS — I10 Essential (primary) hypertension: Secondary | ICD-10-CM | POA: Diagnosis not present

## 2017-03-12 DIAGNOSIS — E785 Hyperlipidemia, unspecified: Secondary | ICD-10-CM | POA: Diagnosis not present

## 2017-03-12 DIAGNOSIS — Z96641 Presence of right artificial hip joint: Secondary | ICD-10-CM | POA: Diagnosis not present

## 2017-03-12 DIAGNOSIS — E119 Type 2 diabetes mellitus without complications: Secondary | ICD-10-CM | POA: Diagnosis not present

## 2017-03-14 DIAGNOSIS — E785 Hyperlipidemia, unspecified: Secondary | ICD-10-CM | POA: Diagnosis not present

## 2017-03-14 DIAGNOSIS — M1990 Unspecified osteoarthritis, unspecified site: Secondary | ICD-10-CM | POA: Diagnosis not present

## 2017-03-14 DIAGNOSIS — Z7984 Long term (current) use of oral hypoglycemic drugs: Secondary | ICD-10-CM | POA: Diagnosis not present

## 2017-03-14 DIAGNOSIS — I10 Essential (primary) hypertension: Secondary | ICD-10-CM | POA: Diagnosis not present

## 2017-03-14 DIAGNOSIS — Z87442 Personal history of urinary calculi: Secondary | ICD-10-CM | POA: Diagnosis not present

## 2017-03-14 DIAGNOSIS — E119 Type 2 diabetes mellitus without complications: Secondary | ICD-10-CM | POA: Diagnosis not present

## 2017-03-14 DIAGNOSIS — Z471 Aftercare following joint replacement surgery: Secondary | ICD-10-CM | POA: Diagnosis not present

## 2017-03-14 DIAGNOSIS — Z96641 Presence of right artificial hip joint: Secondary | ICD-10-CM | POA: Diagnosis not present

## 2017-03-18 DIAGNOSIS — M1990 Unspecified osteoarthritis, unspecified site: Secondary | ICD-10-CM | POA: Diagnosis not present

## 2017-03-18 DIAGNOSIS — Z7984 Long term (current) use of oral hypoglycemic drugs: Secondary | ICD-10-CM | POA: Diagnosis not present

## 2017-03-18 DIAGNOSIS — Z471 Aftercare following joint replacement surgery: Secondary | ICD-10-CM | POA: Diagnosis not present

## 2017-03-18 DIAGNOSIS — E119 Type 2 diabetes mellitus without complications: Secondary | ICD-10-CM | POA: Diagnosis not present

## 2017-03-18 DIAGNOSIS — Z87442 Personal history of urinary calculi: Secondary | ICD-10-CM | POA: Diagnosis not present

## 2017-03-18 DIAGNOSIS — E785 Hyperlipidemia, unspecified: Secondary | ICD-10-CM | POA: Diagnosis not present

## 2017-03-18 DIAGNOSIS — I10 Essential (primary) hypertension: Secondary | ICD-10-CM | POA: Diagnosis not present

## 2017-03-18 DIAGNOSIS — Z96641 Presence of right artificial hip joint: Secondary | ICD-10-CM | POA: Diagnosis not present

## 2017-03-20 DIAGNOSIS — Z7984 Long term (current) use of oral hypoglycemic drugs: Secondary | ICD-10-CM | POA: Diagnosis not present

## 2017-03-20 DIAGNOSIS — E785 Hyperlipidemia, unspecified: Secondary | ICD-10-CM | POA: Diagnosis not present

## 2017-03-20 DIAGNOSIS — I10 Essential (primary) hypertension: Secondary | ICD-10-CM | POA: Diagnosis not present

## 2017-03-20 DIAGNOSIS — M1990 Unspecified osteoarthritis, unspecified site: Secondary | ICD-10-CM | POA: Diagnosis not present

## 2017-03-20 DIAGNOSIS — Z87442 Personal history of urinary calculi: Secondary | ICD-10-CM | POA: Diagnosis not present

## 2017-03-20 DIAGNOSIS — E119 Type 2 diabetes mellitus without complications: Secondary | ICD-10-CM | POA: Diagnosis not present

## 2017-03-20 DIAGNOSIS — Z96641 Presence of right artificial hip joint: Secondary | ICD-10-CM | POA: Diagnosis not present

## 2017-03-20 DIAGNOSIS — Z471 Aftercare following joint replacement surgery: Secondary | ICD-10-CM | POA: Diagnosis not present

## 2017-03-25 DIAGNOSIS — Z87442 Personal history of urinary calculi: Secondary | ICD-10-CM | POA: Diagnosis not present

## 2017-03-25 DIAGNOSIS — I10 Essential (primary) hypertension: Secondary | ICD-10-CM | POA: Diagnosis not present

## 2017-03-25 DIAGNOSIS — Z96641 Presence of right artificial hip joint: Secondary | ICD-10-CM | POA: Diagnosis not present

## 2017-03-25 DIAGNOSIS — M1990 Unspecified osteoarthritis, unspecified site: Secondary | ICD-10-CM | POA: Diagnosis not present

## 2017-03-25 DIAGNOSIS — Z471 Aftercare following joint replacement surgery: Secondary | ICD-10-CM | POA: Diagnosis not present

## 2017-03-25 DIAGNOSIS — E785 Hyperlipidemia, unspecified: Secondary | ICD-10-CM | POA: Diagnosis not present

## 2017-03-25 DIAGNOSIS — E119 Type 2 diabetes mellitus without complications: Secondary | ICD-10-CM | POA: Diagnosis not present

## 2017-03-25 DIAGNOSIS — Z7984 Long term (current) use of oral hypoglycemic drugs: Secondary | ICD-10-CM | POA: Diagnosis not present

## 2017-03-26 DIAGNOSIS — R52 Pain, unspecified: Secondary | ICD-10-CM | POA: Diagnosis not present

## 2017-03-28 DIAGNOSIS — E119 Type 2 diabetes mellitus without complications: Secondary | ICD-10-CM | POA: Diagnosis not present

## 2017-03-28 DIAGNOSIS — Z7984 Long term (current) use of oral hypoglycemic drugs: Secondary | ICD-10-CM | POA: Diagnosis not present

## 2017-03-28 DIAGNOSIS — I10 Essential (primary) hypertension: Secondary | ICD-10-CM | POA: Diagnosis not present

## 2017-03-28 DIAGNOSIS — Z471 Aftercare following joint replacement surgery: Secondary | ICD-10-CM | POA: Diagnosis not present

## 2017-03-28 DIAGNOSIS — E785 Hyperlipidemia, unspecified: Secondary | ICD-10-CM | POA: Diagnosis not present

## 2017-03-28 DIAGNOSIS — Z87442 Personal history of urinary calculi: Secondary | ICD-10-CM | POA: Diagnosis not present

## 2017-03-28 DIAGNOSIS — Z96641 Presence of right artificial hip joint: Secondary | ICD-10-CM | POA: Diagnosis not present

## 2017-03-28 DIAGNOSIS — M1990 Unspecified osteoarthritis, unspecified site: Secondary | ICD-10-CM | POA: Diagnosis not present

## 2017-04-23 DIAGNOSIS — R52 Pain, unspecified: Secondary | ICD-10-CM | POA: Diagnosis not present

## 2017-05-20 DIAGNOSIS — Z6830 Body mass index (BMI) 30.0-30.9, adult: Secondary | ICD-10-CM | POA: Diagnosis not present

## 2017-05-20 DIAGNOSIS — E7849 Other hyperlipidemia: Secondary | ICD-10-CM | POA: Diagnosis not present

## 2017-05-20 DIAGNOSIS — I10 Essential (primary) hypertension: Secondary | ICD-10-CM | POA: Diagnosis not present

## 2017-05-20 DIAGNOSIS — E119 Type 2 diabetes mellitus without complications: Secondary | ICD-10-CM | POA: Diagnosis not present

## 2017-06-04 DIAGNOSIS — R52 Pain, unspecified: Secondary | ICD-10-CM | POA: Diagnosis not present

## 2017-09-16 DIAGNOSIS — H5213 Myopia, bilateral: Secondary | ICD-10-CM | POA: Diagnosis not present

## 2017-09-16 DIAGNOSIS — E119 Type 2 diabetes mellitus without complications: Secondary | ICD-10-CM | POA: Diagnosis not present

## 2017-09-16 DIAGNOSIS — H524 Presbyopia: Secondary | ICD-10-CM | POA: Diagnosis not present

## 2017-09-22 DIAGNOSIS — E119 Type 2 diabetes mellitus without complications: Secondary | ICD-10-CM | POA: Diagnosis not present

## 2017-09-22 DIAGNOSIS — E7849 Other hyperlipidemia: Secondary | ICD-10-CM | POA: Diagnosis not present

## 2017-09-22 DIAGNOSIS — R202 Paresthesia of skin: Secondary | ICD-10-CM | POA: Diagnosis not present

## 2017-09-22 DIAGNOSIS — I10 Essential (primary) hypertension: Secondary | ICD-10-CM | POA: Diagnosis not present

## 2018-01-20 DIAGNOSIS — S0502XA Injury of conjunctiva and corneal abrasion without foreign body, left eye, initial encounter: Secondary | ICD-10-CM | POA: Diagnosis not present

## 2018-03-17 DIAGNOSIS — E119 Type 2 diabetes mellitus without complications: Secondary | ICD-10-CM | POA: Diagnosis not present

## 2018-03-17 DIAGNOSIS — Z125 Encounter for screening for malignant neoplasm of prostate: Secondary | ICD-10-CM | POA: Diagnosis not present

## 2018-03-17 DIAGNOSIS — I10 Essential (primary) hypertension: Secondary | ICD-10-CM | POA: Diagnosis not present

## 2018-03-17 DIAGNOSIS — Z0000 Encounter for general adult medical examination without abnormal findings: Secondary | ICD-10-CM | POA: Diagnosis not present

## 2018-03-17 DIAGNOSIS — R82998 Other abnormal findings in urine: Secondary | ICD-10-CM | POA: Diagnosis not present

## 2018-03-24 DIAGNOSIS — R808 Other proteinuria: Secondary | ICD-10-CM | POA: Diagnosis not present

## 2018-03-24 DIAGNOSIS — R202 Paresthesia of skin: Secondary | ICD-10-CM | POA: Diagnosis not present

## 2018-03-24 DIAGNOSIS — I10 Essential (primary) hypertension: Secondary | ICD-10-CM | POA: Diagnosis not present

## 2018-03-24 DIAGNOSIS — Z1331 Encounter for screening for depression: Secondary | ICD-10-CM | POA: Diagnosis not present

## 2018-03-24 DIAGNOSIS — E119 Type 2 diabetes mellitus without complications: Secondary | ICD-10-CM | POA: Diagnosis not present

## 2018-03-24 DIAGNOSIS — Z0000 Encounter for general adult medical examination without abnormal findings: Secondary | ICD-10-CM | POA: Diagnosis not present

## 2018-03-24 DIAGNOSIS — Z23 Encounter for immunization: Secondary | ICD-10-CM | POA: Diagnosis not present

## 2018-03-26 DIAGNOSIS — Z1212 Encounter for screening for malignant neoplasm of rectum: Secondary | ICD-10-CM | POA: Diagnosis not present

## 2018-04-08 DIAGNOSIS — Z1212 Encounter for screening for malignant neoplasm of rectum: Secondary | ICD-10-CM | POA: Diagnosis not present

## 2018-04-08 DIAGNOSIS — Z1211 Encounter for screening for malignant neoplasm of colon: Secondary | ICD-10-CM | POA: Diagnosis not present

## 2018-05-07 DIAGNOSIS — E7849 Other hyperlipidemia: Secondary | ICD-10-CM | POA: Diagnosis not present

## 2018-05-07 DIAGNOSIS — I10 Essential (primary) hypertension: Secondary | ICD-10-CM | POA: Diagnosis not present

## 2018-05-07 DIAGNOSIS — Z6832 Body mass index (BMI) 32.0-32.9, adult: Secondary | ICD-10-CM | POA: Diagnosis not present

## 2018-05-07 DIAGNOSIS — E119 Type 2 diabetes mellitus without complications: Secondary | ICD-10-CM | POA: Diagnosis not present

## 2018-06-18 DIAGNOSIS — I10 Essential (primary) hypertension: Secondary | ICD-10-CM | POA: Diagnosis not present

## 2018-06-18 DIAGNOSIS — E119 Type 2 diabetes mellitus without complications: Secondary | ICD-10-CM | POA: Diagnosis not present

## 2018-07-24 DIAGNOSIS — E119 Type 2 diabetes mellitus without complications: Secondary | ICD-10-CM | POA: Diagnosis not present

## 2018-07-24 DIAGNOSIS — E785 Hyperlipidemia, unspecified: Secondary | ICD-10-CM | POA: Diagnosis not present

## 2018-07-24 DIAGNOSIS — M5136 Other intervertebral disc degeneration, lumbar region: Secondary | ICD-10-CM | POA: Diagnosis not present

## 2018-07-24 DIAGNOSIS — I10 Essential (primary) hypertension: Secondary | ICD-10-CM | POA: Diagnosis not present

## 2018-09-23 DIAGNOSIS — E119 Type 2 diabetes mellitus without complications: Secondary | ICD-10-CM | POA: Diagnosis not present

## 2018-09-23 DIAGNOSIS — I10 Essential (primary) hypertension: Secondary | ICD-10-CM | POA: Diagnosis not present

## 2018-09-29 DIAGNOSIS — H524 Presbyopia: Secondary | ICD-10-CM | POA: Diagnosis not present

## 2018-09-29 DIAGNOSIS — E119 Type 2 diabetes mellitus without complications: Secondary | ICD-10-CM | POA: Diagnosis not present

## 2018-09-29 DIAGNOSIS — H5213 Myopia, bilateral: Secondary | ICD-10-CM | POA: Diagnosis not present

## 2019-03-26 DIAGNOSIS — K469 Unspecified abdominal hernia without obstruction or gangrene: Secondary | ICD-10-CM | POA: Diagnosis not present

## 2019-03-26 DIAGNOSIS — R1909 Other intra-abdominal and pelvic swelling, mass and lump: Secondary | ICD-10-CM | POA: Diagnosis not present

## 2019-03-26 DIAGNOSIS — M5416 Radiculopathy, lumbar region: Secondary | ICD-10-CM | POA: Diagnosis not present

## 2019-03-26 DIAGNOSIS — R200 Anesthesia of skin: Secondary | ICD-10-CM | POA: Diagnosis not present

## 2019-03-31 DIAGNOSIS — K469 Unspecified abdominal hernia without obstruction or gangrene: Secondary | ICD-10-CM | POA: Diagnosis not present

## 2019-03-31 DIAGNOSIS — E119 Type 2 diabetes mellitus without complications: Secondary | ICD-10-CM | POA: Diagnosis not present

## 2019-03-31 DIAGNOSIS — I10 Essential (primary) hypertension: Secondary | ICD-10-CM | POA: Diagnosis not present

## 2019-04-01 DIAGNOSIS — E119 Type 2 diabetes mellitus without complications: Secondary | ICD-10-CM | POA: Diagnosis not present

## 2019-04-21 DIAGNOSIS — E119 Type 2 diabetes mellitus without complications: Secondary | ICD-10-CM | POA: Diagnosis not present

## 2019-04-21 DIAGNOSIS — Z125 Encounter for screening for malignant neoplasm of prostate: Secondary | ICD-10-CM | POA: Diagnosis not present

## 2019-04-21 DIAGNOSIS — E7849 Other hyperlipidemia: Secondary | ICD-10-CM | POA: Diagnosis not present

## 2019-04-28 DIAGNOSIS — R1909 Other intra-abdominal and pelvic swelling, mass and lump: Secondary | ICD-10-CM | POA: Diagnosis not present

## 2019-04-28 DIAGNOSIS — E291 Testicular hypofunction: Secondary | ICD-10-CM | POA: Diagnosis not present

## 2019-04-28 DIAGNOSIS — Z0000 Encounter for general adult medical examination without abnormal findings: Secondary | ICD-10-CM | POA: Diagnosis not present

## 2019-04-28 DIAGNOSIS — M5416 Radiculopathy, lumbar region: Secondary | ICD-10-CM | POA: Diagnosis not present

## 2019-04-28 DIAGNOSIS — R200 Anesthesia of skin: Secondary | ICD-10-CM | POA: Diagnosis not present

## 2019-04-28 DIAGNOSIS — R82998 Other abnormal findings in urine: Secondary | ICD-10-CM | POA: Diagnosis not present

## 2019-04-28 DIAGNOSIS — K469 Unspecified abdominal hernia without obstruction or gangrene: Secondary | ICD-10-CM | POA: Diagnosis not present

## 2019-04-28 DIAGNOSIS — Z1389 Encounter for screening for other disorder: Secondary | ICD-10-CM | POA: Diagnosis not present

## 2019-04-28 DIAGNOSIS — I10 Essential (primary) hypertension: Secondary | ICD-10-CM | POA: Diagnosis not present

## 2019-04-30 DIAGNOSIS — Z1212 Encounter for screening for malignant neoplasm of rectum: Secondary | ICD-10-CM | POA: Diagnosis not present

## 2019-07-06 DIAGNOSIS — E119 Type 2 diabetes mellitus without complications: Secondary | ICD-10-CM | POA: Diagnosis not present

## 2019-07-06 DIAGNOSIS — I10 Essential (primary) hypertension: Secondary | ICD-10-CM | POA: Diagnosis not present

## 2019-09-07 DIAGNOSIS — K469 Unspecified abdominal hernia without obstruction or gangrene: Secondary | ICD-10-CM | POA: Diagnosis not present

## 2019-09-07 DIAGNOSIS — R200 Anesthesia of skin: Secondary | ICD-10-CM | POA: Diagnosis not present

## 2019-09-07 DIAGNOSIS — R1909 Other intra-abdominal and pelvic swelling, mass and lump: Secondary | ICD-10-CM | POA: Diagnosis not present

## 2019-09-07 DIAGNOSIS — M5416 Radiculopathy, lumbar region: Secondary | ICD-10-CM | POA: Diagnosis not present

## 2019-10-01 DIAGNOSIS — H2513 Age-related nuclear cataract, bilateral: Secondary | ICD-10-CM | POA: Diagnosis not present

## 2019-10-01 DIAGNOSIS — H5213 Myopia, bilateral: Secondary | ICD-10-CM | POA: Diagnosis not present

## 2019-10-01 DIAGNOSIS — E119 Type 2 diabetes mellitus without complications: Secondary | ICD-10-CM | POA: Diagnosis not present

## 2019-10-01 DIAGNOSIS — H25013 Cortical age-related cataract, bilateral: Secondary | ICD-10-CM | POA: Diagnosis not present

## 2019-10-07 DIAGNOSIS — E1169 Type 2 diabetes mellitus with other specified complication: Secondary | ICD-10-CM | POA: Diagnosis not present

## 2019-10-07 DIAGNOSIS — I10 Essential (primary) hypertension: Secondary | ICD-10-CM | POA: Diagnosis not present

## 2020-02-08 DIAGNOSIS — I10 Essential (primary) hypertension: Secondary | ICD-10-CM | POA: Diagnosis not present

## 2020-02-08 DIAGNOSIS — E1169 Type 2 diabetes mellitus with other specified complication: Secondary | ICD-10-CM | POA: Diagnosis not present

## 2020-05-05 DIAGNOSIS — E785 Hyperlipidemia, unspecified: Secondary | ICD-10-CM | POA: Diagnosis not present

## 2020-05-05 DIAGNOSIS — Z125 Encounter for screening for malignant neoplasm of prostate: Secondary | ICD-10-CM | POA: Diagnosis not present

## 2020-05-05 DIAGNOSIS — E291 Testicular hypofunction: Secondary | ICD-10-CM | POA: Diagnosis not present

## 2020-05-05 DIAGNOSIS — E119 Type 2 diabetes mellitus without complications: Secondary | ICD-10-CM | POA: Diagnosis not present

## 2020-06-23 DIAGNOSIS — R82998 Other abnormal findings in urine: Secondary | ICD-10-CM | POA: Diagnosis not present

## 2020-06-23 DIAGNOSIS — I10 Essential (primary) hypertension: Secondary | ICD-10-CM | POA: Diagnosis not present

## 2020-06-23 DIAGNOSIS — Z0000 Encounter for general adult medical examination without abnormal findings: Secondary | ICD-10-CM | POA: Diagnosis not present

## 2020-06-23 DIAGNOSIS — E1169 Type 2 diabetes mellitus with other specified complication: Secondary | ICD-10-CM | POA: Diagnosis not present

## 2020-06-23 DIAGNOSIS — Z1331 Encounter for screening for depression: Secondary | ICD-10-CM | POA: Diagnosis not present

## 2020-08-09 DIAGNOSIS — E1169 Type 2 diabetes mellitus with other specified complication: Secondary | ICD-10-CM | POA: Diagnosis not present

## 2020-12-26 DIAGNOSIS — E1169 Type 2 diabetes mellitus with other specified complication: Secondary | ICD-10-CM | POA: Diagnosis not present

## 2020-12-26 DIAGNOSIS — I10 Essential (primary) hypertension: Secondary | ICD-10-CM | POA: Diagnosis not present

## 2021-08-01 DIAGNOSIS — E1169 Type 2 diabetes mellitus with other specified complication: Secondary | ICD-10-CM | POA: Diagnosis not present

## 2021-08-01 DIAGNOSIS — Z125 Encounter for screening for malignant neoplasm of prostate: Secondary | ICD-10-CM | POA: Diagnosis not present

## 2021-08-01 DIAGNOSIS — E291 Testicular hypofunction: Secondary | ICD-10-CM | POA: Diagnosis not present

## 2021-08-08 ENCOUNTER — Other Ambulatory Visit: Payer: Self-pay | Admitting: Internal Medicine

## 2021-08-08 DIAGNOSIS — E1169 Type 2 diabetes mellitus with other specified complication: Secondary | ICD-10-CM | POA: Diagnosis not present

## 2021-08-08 DIAGNOSIS — Z0000 Encounter for general adult medical examination without abnormal findings: Secondary | ICD-10-CM | POA: Diagnosis not present

## 2021-08-08 DIAGNOSIS — Z23 Encounter for immunization: Secondary | ICD-10-CM | POA: Diagnosis not present

## 2021-08-08 DIAGNOSIS — R82998 Other abnormal findings in urine: Secondary | ICD-10-CM | POA: Diagnosis not present

## 2021-08-08 DIAGNOSIS — Z1331 Encounter for screening for depression: Secondary | ICD-10-CM | POA: Diagnosis not present

## 2021-08-08 DIAGNOSIS — E785 Hyperlipidemia, unspecified: Secondary | ICD-10-CM

## 2021-08-28 DIAGNOSIS — Z1211 Encounter for screening for malignant neoplasm of colon: Secondary | ICD-10-CM | POA: Diagnosis not present

## 2021-09-10 ENCOUNTER — Ambulatory Visit
Admission: RE | Admit: 2021-09-10 | Discharge: 2021-09-10 | Disposition: A | Discharge status: Home / Self Care | Payer: No Typology Code available for payment source | Source: Ambulatory Visit | Attending: Internal Medicine | Admitting: Internal Medicine

## 2021-09-10 DIAGNOSIS — E785 Hyperlipidemia, unspecified: Secondary | ICD-10-CM

## 2022-08-12 IMAGING — CT CT CARDIAC CORONARY ARTERY CALCIUM SCORE
3 series · 14 of 20 positions shown, 16 images · non-contrast
Comparison: None Available.

CLINICAL DATA: 56-year-old Caucasian male.

EXAM:
CT CARDIAC CORONARY ARTERY CALCIUM SCORE
TECHNIQUE: Non-contrast imaging through the heart was performed using
prospective ECG gating. Image post processing was performed on an
independent workstation, allowing for quantitative analysis of the
heart and coronary arteries. Note that this exam targets the heart
and the chest was not imaged in its entirety.

[Series 2: calcium scoring 2.00 qr36 bestdiast 70% hrt calciu · axial · 0.43mm/px · z∈[+1597,+1681]mm · 4 of 70 slices shown]
[im 14/70  vessel]
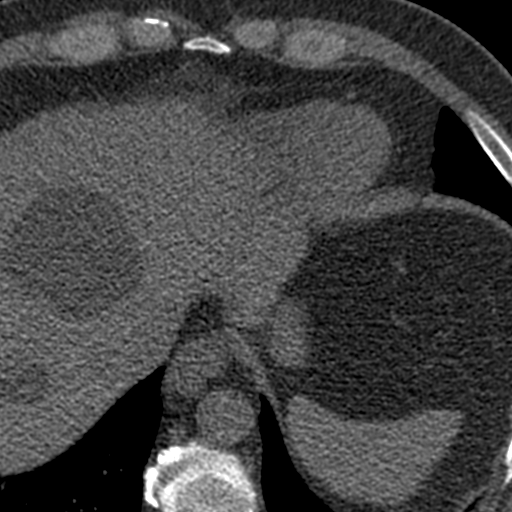
[im 28/70  vessel]
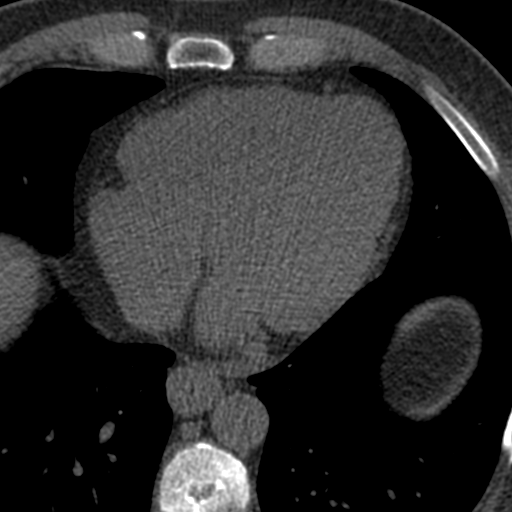
[im 42/70  vessel]
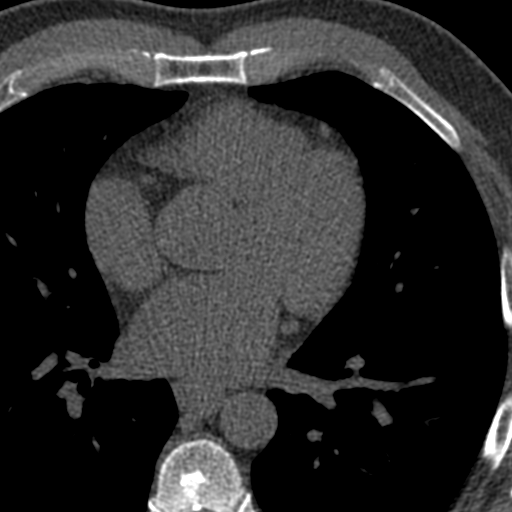
[im 56/70  vessel]
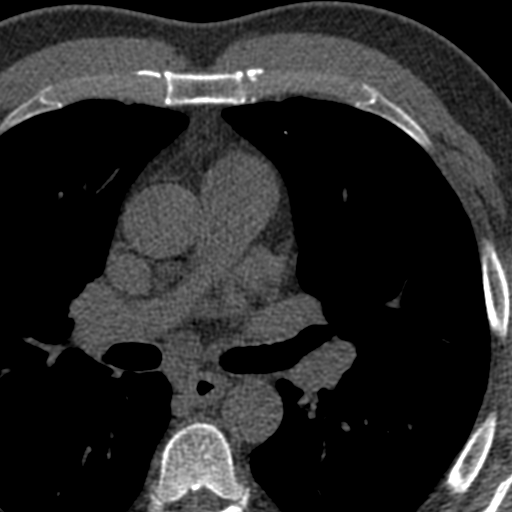

[Series 3: calcium scoring 2.00 br40 bestdiast 70% axial · axial · 0.72mm/px · z∈[+1593,+1685]mm · 5 of 70 slices shown, 7 images]
[im 12/70  vessel]
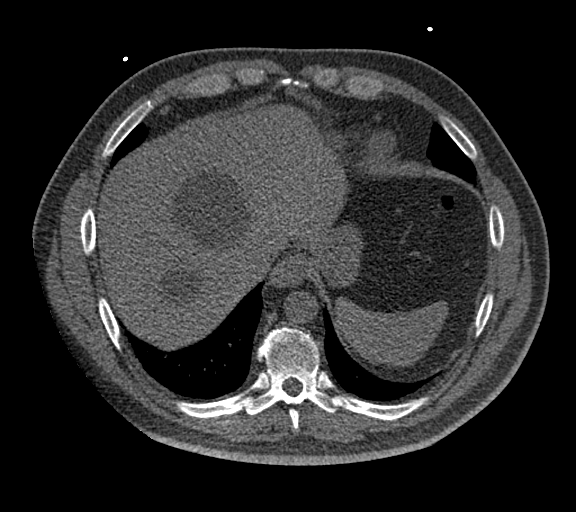
[im 12/70  lung]
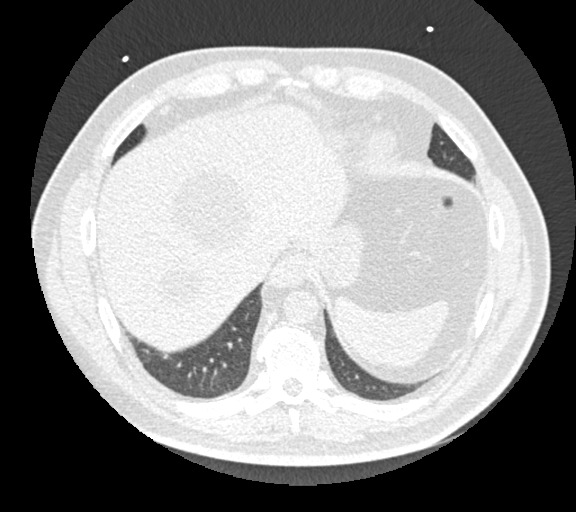
[im 24/70  vessel]
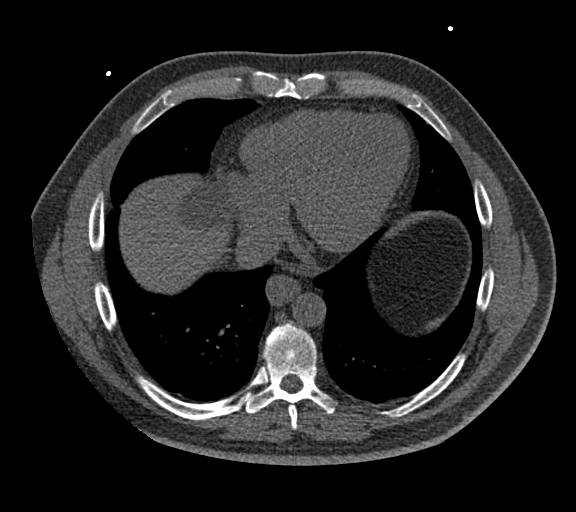
[im 35/70  vessel]
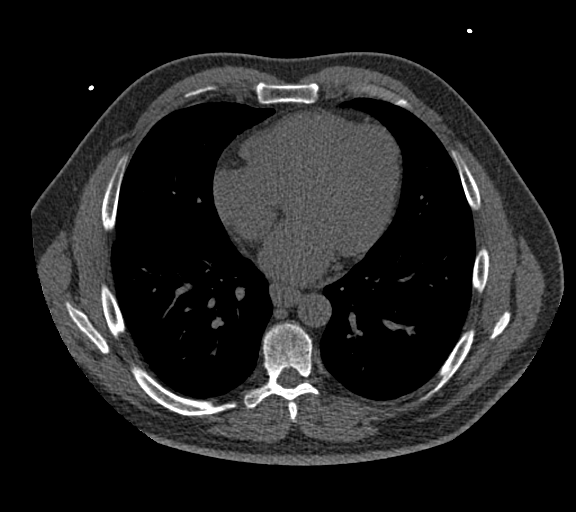
[im 47/70  vessel]
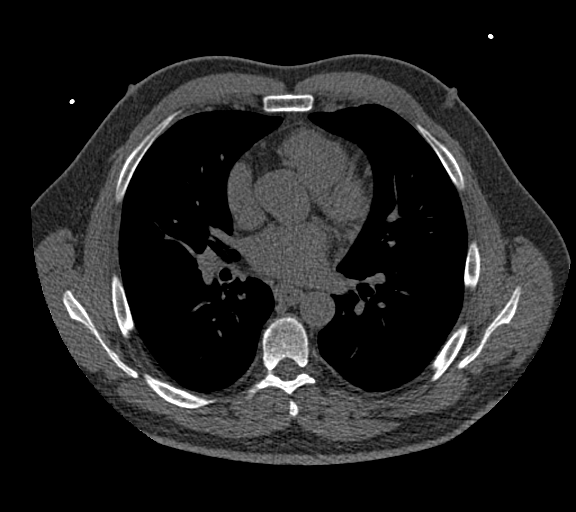
[im 58/70  vessel]
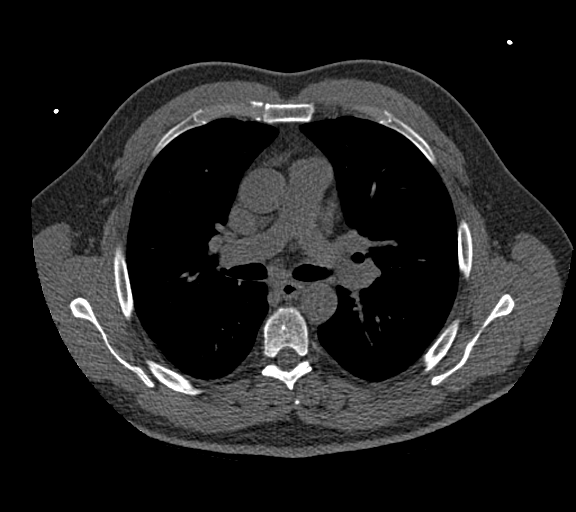
[im 58/70  lung]
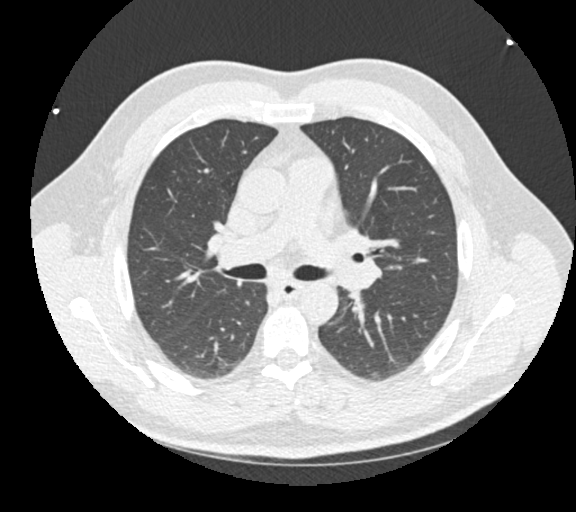

[Series 9: calcium scoring 2.00 br60 bestdiast 70% lungs · axial · 0.72mm/px · z∈[+1593,+1685]mm · 5 of 70 slices shown]
[im 12/70  vessel]
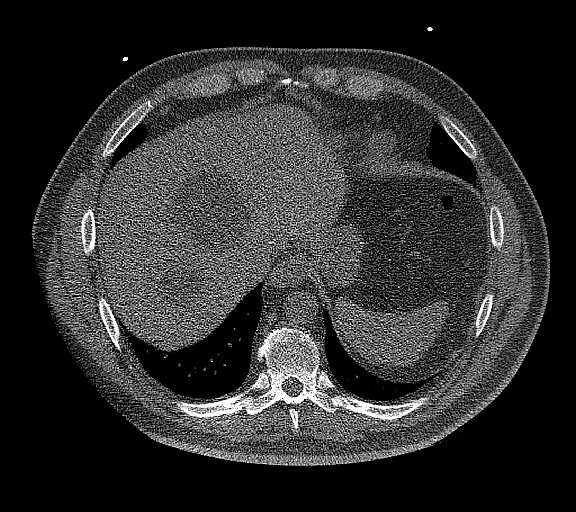
[im 24/70  vessel]
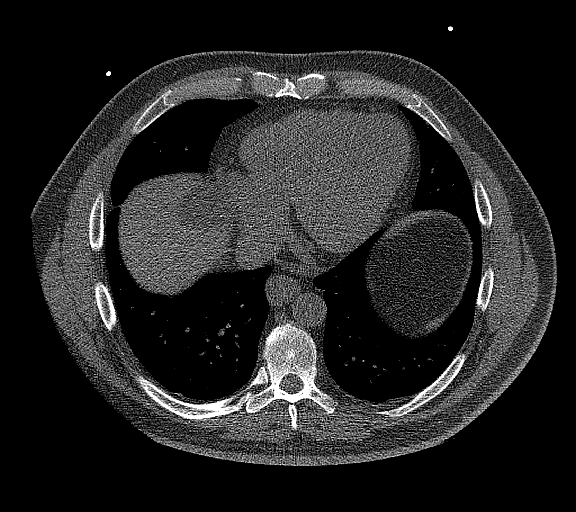
[im 35/70  vessel]
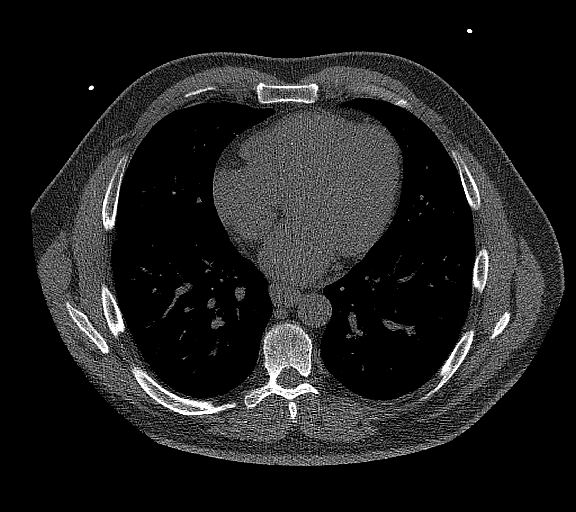
[im 47/70  vessel]
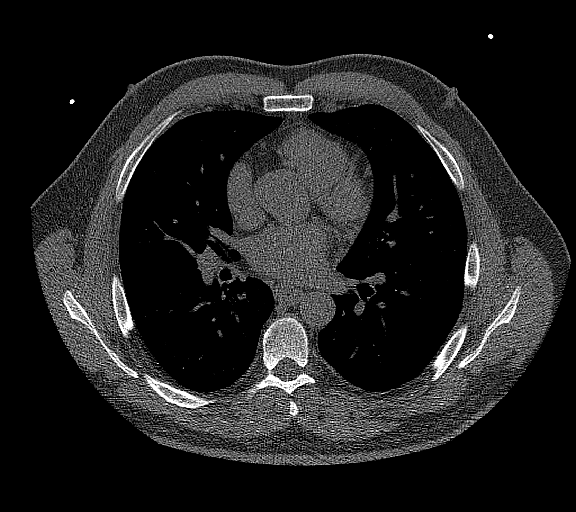
[im 58/70  vessel]
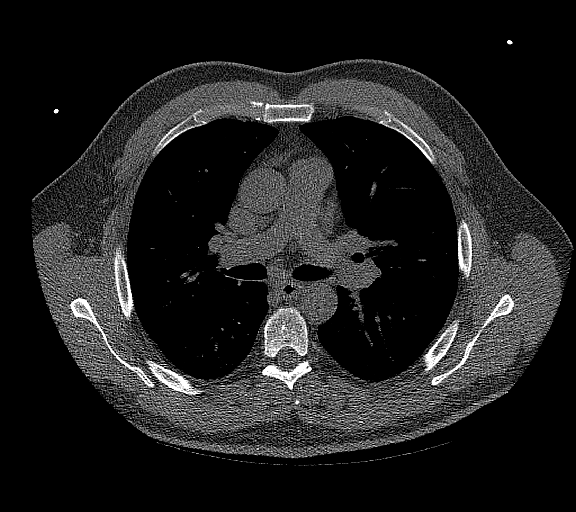

[14 of 20 positions shown; findings below may reference images not displayed]

FINDINGS: Technical quality: Good

CORONARY CALCIUM SCORES:

Left Main: No coronary artery calcification

LAD: 143

LCx: No coronary calcification

RCA: No coronary calcification

CORONARY CALCIUM

Total Agatston Score: 143

[HOSPITAL] percentile: 86

Ascending aorta (normal <  40 mm): 34 mm

EXTRACARDIAC FINDINGS:

Limited view of the lung parenchyma demonstrates no suspicious
nodularity. Airways are normal.

Limited view of the mediastinum demonstrates no adenopathy.
Esophagus normal.

Limited view of the upper abdomen demonstrates several benign cysts
within the liver.

Limited view of the skeleton and chest wall is unremarkable.
IMPRESSION: 1. LAD coronary artery calcification.

2. Total Agatston Score: 143

3. MESA age and sex matched database percentile: 86th

## 2022-09-02 DIAGNOSIS — I10 Essential (primary) hypertension: Secondary | ICD-10-CM | POA: Diagnosis not present

## 2022-09-02 DIAGNOSIS — E1169 Type 2 diabetes mellitus with other specified complication: Secondary | ICD-10-CM | POA: Diagnosis not present

## 2022-09-02 DIAGNOSIS — E291 Testicular hypofunction: Secondary | ICD-10-CM | POA: Diagnosis not present

## 2022-09-02 DIAGNOSIS — E785 Hyperlipidemia, unspecified: Secondary | ICD-10-CM | POA: Diagnosis not present

## 2022-09-02 DIAGNOSIS — R7989 Other specified abnormal findings of blood chemistry: Secondary | ICD-10-CM | POA: Diagnosis not present

## 2022-09-02 DIAGNOSIS — Z125 Encounter for screening for malignant neoplasm of prostate: Secondary | ICD-10-CM | POA: Diagnosis not present

## 2022-09-09 DIAGNOSIS — E669 Obesity, unspecified: Secondary | ICD-10-CM | POA: Diagnosis not present

## 2022-09-09 DIAGNOSIS — E291 Testicular hypofunction: Secondary | ICD-10-CM | POA: Diagnosis not present

## 2022-09-09 DIAGNOSIS — Z1331 Encounter for screening for depression: Secondary | ICD-10-CM | POA: Diagnosis not present

## 2022-09-09 DIAGNOSIS — E1169 Type 2 diabetes mellitus with other specified complication: Secondary | ICD-10-CM | POA: Diagnosis not present

## 2022-09-09 DIAGNOSIS — R82998 Other abnormal findings in urine: Secondary | ICD-10-CM | POA: Diagnosis not present

## 2022-09-09 DIAGNOSIS — E785 Hyperlipidemia, unspecified: Secondary | ICD-10-CM | POA: Diagnosis not present

## 2022-09-09 DIAGNOSIS — I10 Essential (primary) hypertension: Secondary | ICD-10-CM | POA: Diagnosis not present

## 2022-09-09 DIAGNOSIS — Z0000 Encounter for general adult medical examination without abnormal findings: Secondary | ICD-10-CM | POA: Diagnosis not present

## 2022-12-12 DIAGNOSIS — E1169 Type 2 diabetes mellitus with other specified complication: Secondary | ICD-10-CM | POA: Diagnosis not present

## 2023-06-27 DIAGNOSIS — E1169 Type 2 diabetes mellitus with other specified complication: Secondary | ICD-10-CM | POA: Diagnosis not present

## 2023-11-13 DIAGNOSIS — T1502XA Foreign body in cornea, left eye, initial encounter: Secondary | ICD-10-CM | POA: Diagnosis not present

## 2023-11-13 DIAGNOSIS — S0502XA Injury of conjunctiva and corneal abrasion without foreign body, left eye, initial encounter: Secondary | ICD-10-CM | POA: Diagnosis not present

## 2023-11-13 DIAGNOSIS — H5712 Ocular pain, left eye: Secondary | ICD-10-CM | POA: Diagnosis not present

## 2024-03-22 ENCOUNTER — Encounter: Payer: Self-pay | Admitting: Emergency Medicine

## 2024-03-22 ENCOUNTER — Ambulatory Visit: Admission: EM | Admit: 2024-03-22 | Discharge: 2024-03-22 | Disposition: A | Discharge status: Home / Self Care

## 2024-03-22 DIAGNOSIS — L600 Ingrowing nail: Secondary | ICD-10-CM

## 2024-03-22 DIAGNOSIS — L03031 Cellulitis of right toe: Secondary | ICD-10-CM

## 2024-03-22 MED ORDER — MUPIROCIN 2 % EX OINT: 1.0000 | TOPICAL_OINTMENT | Freq: Two times a day (BID) | CUTANEOUS | 0 refills | Status: AC

## 2024-03-22 MED ORDER — CEPHALEXIN 500 MG PO CAPS: 500.0000 mg | ORAL_CAPSULE | Freq: Two times a day (BID) | ORAL | 0 refills | Status: AC

## 2024-03-22 NOTE — Discharge Instructions (Addendum)
 You appear to have an infected ingrown toenail.  Take the Keflex twice daily with food for the next 5 days.  Soak the toe in warm water and Epsom salt 2-3 times daily for 15 minutes at a time.  Afterwards you can pat dry and apply the Bactroban.  When wearing shoes please keep the area covered.   Schedule an appointment with podiatry in the next week or so for reevaluation, where they can consider ingrown toenail removal.  Return to clinic for any new or urgent symptoms.

## 2024-03-22 NOTE — ED Triage Notes (Signed)
 Pt reports ingrown toenail to R great toe that he noticed yesterday. Pt reports trying to cut it out himself at home with no success. Notes throbbing and redness around nail. Constant throbbing pain. Took ibuprofen to get to sleep last night.

## 2024-03-22 NOTE — ED Provider Notes (Signed)
 [facility]    CSN: [id number] Arrival date & time: 03/22/24  1113      History   Chief Complaint Chief Complaint  Patient presents with   Nail Problem    HPI Luis Bentley is a 58 y.o. male.   Patient presents to clinic over concern of a painful ingrown toenail to the right lateral great toe.  Noticed the area yesterday.  He tried to cut it out at home but it was too painful.  Today the toe is throbbing, erythematous and swollen.  Has had some purulent discharge around the base of the nail.  Did take ibuprofen last night so he could go to bed.  Does have a history of type 2 diabetes, is unsure what his last A1c is but he is currently taking metformin .   The history is provided by the patient and medical records.    Past Medical History:  Diagnosis Date   Allergy    Elevated blood pressure    few elevated readings-never used medication   Type II or unspecified type diabetes mellitus without mention of complication, uncontrolled 11/14/2011    Patient Active Problem List   Diagnosis Date Noted   Status post total hip replacement, left 03/11/2017   Osteoarthritis of left hip 01/28/2017   Pilonidal cyst with abscess-multirecurrent left 10/28/2012   Type II or unspecified type diabetes mellitus without mention of complication, uncontrolled 11/14/2011   Ureteral calculus, left 04/03/2011   Gross hematuria 12/14/2010   Palpitations 05/30/2010   HYPERTENSION 01/03/2007   HAY FEVER 01/03/2007    Past Surgical History:  Procedure Laterality Date   ANKLE ARTHROSCOPY     CYSTOSCOPY/RETROGRADE/URETEROSCOPY  04/03/2011   Procedure: CYSTOSCOPY/RETROGRADE/URETEROSCOPY;  Surgeon: Alm GORMAN Fragmin, MD;  Location: [facility];  Service: Urology;  Laterality: Left;   ELBOW ARTHROSCOPY         Home Medications    Prior to Admission medications   Medication Sig Start Date End Date Taking? Authorizing Provider  cephALEXin (KEFLEX) 500 MG capsule Take  1 capsule (500 mg total) by mouth 2 (two) times daily for 5 days. 03/22/24 03/27/24 Yes Ulysees Robarts  N, FNP  metFORMIN  (GLUCOPHAGE ) 500 MG tablet TAKE 1 TABLET TWICE A DAY WITH MEALS NEED. OFFICE VISIT FOR FUTHER REFILLS 02/09/13  Yes Plotnikov, Aleksei V, MD  mupirocin ointment (BACTROBAN) 2 % Apply 1 Application topically 2 (two) times daily. 03/22/24  Yes Dreama, Davie Claud  N, FNP  ACCU-CHEK FASTCLIX LANCETS MISC Inject 1 each as directed daily. Use as directed once daily to check blood sugar.  Diagnosis code 250.02 Patient not taking: Reported on 03/22/2024 11/26/11   Norleen Lynwood ORN, MD  acetaminophen  (TYLENOL ) 500 MG tablet Take 500 mg by mouth every 6 (six) hours as needed. 03/11/17   [provider]  atorvastatin (LIPITOR) 10 MG tablet Take 10 mg by mouth daily. Patient not taking: Reported on 03/22/2024    [provider]  DiphenhydrAMINE HCl (BENADRYL ALLERGY PO) Take by mouth.   Patient not taking: Reported on 03/22/2024    [provider]  empagliflozin (JARDIANCE) 25 MG TABS tablet Take 25 mg by mouth daily. Patient not taking: Reported on 03/22/2024    [provider]  glimepiride (AMARYL) 1 MG tablet Take 1 mg by mouth daily with breakfast. Patient not taking: Reported on 03/22/2024    [provider]  glucose blood (ACCU-CHEK SMARTVIEW) test strip Use as directed once daily to check blood sugar.  Diagnosis code 250.02 Patient not  taking: Reported on 03/22/2024 11/26/11   Norleen Lynwood ORN, MD  HYDROcodone -acetaminophen  (NORCO/VICODIN) 5-325 MG per tablet Take 1 tablet by mouth every 4 (four) hours as needed for pain. Patient not taking: Reported on 03/29/2016 10/28/12   Gladis Cough, MD  irbesartan (AVAPRO) 75 MG tablet Take 75 mg by mouth daily. Patient not taking: Reported on 03/22/2024 02/18/17   [provider]  Magnesium 200 MG TABS Take 1 tablet by mouth daily. Patient not taking: Reported on 03/22/2024    [provider]   moxifloxacin (VIGAMOX) 0.5 % ophthalmic solution 1 drop as directed. Patient not taking: Reported on 03/22/2024 11/13/23   [provider]  Multiple Vitamin (MULTIVITAMIN) tablet Take 1 tablet by mouth daily. Patient not taking: Reported on 03/22/2024    [provider]  traMADol (ULTRAM) 50 MG tablet Take 50 mg by mouth every 6 (six) hours as needed. Patient not taking: Reported on 03/22/2024 03/11/17   [provider]  Vitamin D, Cholecalciferol, 25 MCG (1000 UT) TABS Take 1 tablet by mouth daily. Patient not taking: Reported on 03/22/2024    [provider]    Family History Family History  Problem Relation Age of Onset   Diabetes Paternal Aunt    Diabetes Paternal Uncle    Heart disease Maternal Grandfather     Social History Social History   Tobacco Use   Smoking status: Former    Passive exposure: Past   Smokeless tobacco: Former    Quit date: 03/31/1997  Vaping Use   Vaping status: Never Used  Substance Use Topics   Alcohol  use: Yes    Comment: rare   Drug use: Never     Allergies   Patient has no known allergies.   Review of Systems Review of Systems  Per HPI  Physical Exam Triage Vital Signs ED Triage Vitals  Encounter Vitals Group     BP 03/22/24 1223 (!) 153/104     Girls Systolic BP Percentile --      Girls Diastolic BP Percentile --      Boys Systolic BP Percentile --      Boys Diastolic BP Percentile --      Pulse Rate 03/22/24 1223 95     Resp 03/22/24 1223 14     Temp 03/22/24 1223 98.7 F (37.1 C)     Temp Source 03/22/24 1223 Oral     SpO2 03/22/24 1223 95 %     Weight --      Height --      Head Circumference --      Peak Flow --      Pain Score 03/22/24 1219 5     Pain Loc --      Pain Education --      Exclude from Growth Chart --    No data found.  Updated Vital Signs BP (!) 153/104 (BP Location: Left Arm) Comment: not on BP meds, currently working with new MD who is aware of high bp readings   Pulse 95   Temp 98.7 F (37.1 C) (Oral)   Resp 14   SpO2 95%   Visual Acuity Right Eye Distance:   Left Eye Distance:   Bilateral Distance:    Right Eye Near:   Left Eye Near:    Bilateral Near:     Physical Exam Vitals and nursing note reviewed.  Constitutional:      Appearance: Normal appearance.  HENT:     Head: Normocephalic and atraumatic.  Right Ear: External ear normal.     Left Ear: External ear normal.     Nose: Nose normal.     Mouth/Throat:     Mouth: Mucous membranes are moist.  Eyes:     Conjunctiva/sclera: Conjunctivae normal.  Cardiovascular:     Rate and Rhythm: Normal rate.     Pulses: Normal pulses.          Dorsalis pedis pulses are 2+ on the right side.       Posterior tibial pulses are 2+ on the right side.  Pulmonary:     Effort: Pulmonary effort is normal. No respiratory distress.  Musculoskeletal:        General: Tenderness present. Normal range of motion.       Feet:  Feet:     Right foot:     Toenail Condition: Right toenails are ingrown.  Skin:    General: Skin is warm and dry.     Capillary Refill: Capillary refill takes less than 2 seconds.  Neurological:     General: No focal deficit present.     Mental Status: He is alert and oriented to person, place, and time.  Psychiatric:        Mood and Affect: Mood normal.        Behavior: Behavior normal.      UC Treatments / Results  Labs (all labs ordered are listed, but only abnormal results are displayed) Labs Reviewed - No data to display  EKG   Radiology No results found.  Procedures Procedures (including critical care time)  Medications Ordered in UC Medications - No data to display  Initial Impression / Assessment and Plan / UC Course  I have reviewed the triage vital signs and the nursing notes.  Pertinent labs & imaging results that were available during my care of the patient were reviewed by me and considered in my medical decision making (see chart for  details).  Vitals and triage reviewed, patient is hemodynamically stable.  Ingrown toenail to the right great toe with concern of early cellulitis.  Will treat with Keflex to help prevent bacterial infection, especially in the setting of type 2 diabetes.  Encouraged podiatry follow-up for removal after infection is handled.  Pain management discussed.  Plan of care, follow-up care return precautions given.  No questions at this time.     Final Clinical Impressions(s) / UC Diagnoses   Final diagnoses:  Ingrown toenail of right foot  Paronychia of great toe of right foot     Discharge Instructions      You appear to have an infected ingrown toenail.  Take the Keflex twice daily with food for the next 5 days.  Soak the toe in warm water and Epsom salt 2-3 times daily for 15 minutes at a time.  Afterwards you can pat dry and apply the Bactroban.  When wearing shoes please keep the area covered.   Schedule an appointment with podiatry in the next week or so for reevaluation, where they can consider ingrown toenail removal.  Return to clinic for any new or urgent symptoms.    ED Prescriptions     Medication Sig Dispense Auth. Provider   cephALEXin (KEFLEX) 500 MG capsule Take 1 capsule (500 mg total) by mouth 2 (two) times daily for 5 days. 10 capsule Dreama, Barbarann Kelly  N, FNP   mupirocin ointment (BACTROBAN) 2 % Apply 1 Application topically 2 (two) times daily. 22 g Dreama, Marvin Maenza  LOISE, FNP  PDMP not reviewed this encounter.   Dreama Xavier SAILOR, FNP 03/22/24 1342

## 2024-03-24 ENCOUNTER — Ambulatory Visit: Admitting: Podiatry

## 2024-03-24 ENCOUNTER — Encounter: Payer: Self-pay | Admitting: Podiatry

## 2024-03-24 DIAGNOSIS — L03031 Cellulitis of right toe: Secondary | ICD-10-CM

## 2024-03-24 NOTE — Progress Notes (Signed)
  Subjective:  Patient ID: Luis Bentley, male    DOB: 1965/12/26,   MRN: [id number]  Chief Complaint  Patient presents with   Diabetes    I think I have an ingrown toenail on my big toe on my right foot.  Saw Dr. Shayne - 06/27/2023; A1c - ? (Lateral border)    58 y.o. male presents for concern as above.  . Denies any other pedal complaints. Denies n/v/f/c.   Past Medical History:  Diagnosis Date   Allergy    Elevated blood pressure    few elevated readings-never used medication   Type II or unspecified type diabetes mellitus without mention of complication, uncontrolled 11/14/2011    Objective:  Physical Exam: Vascular: DP/PT pulses 2/4 bilateral. CFT <3 seconds. Normal hair growth on digits. No edema.  Skin. No lacerations or abrasions bilateral feet. Incurvation noted of lateral border of right hallux with erythema edema and mild pruulence noted.  Musculoskeletal: MMT 5/5 bilateral lower extremities in DF, PF, Inversion and Eversion. Deceased ROM in DF of ankle joint.  Neurological: Sensation intact to light touch.   Assessment:   1. Paronychia of great toe of right foot      Plan:  Patient was evaluated and treated and all questions answered. Discussed ingrown toenails etiology and treatment options including procedure for removal vs conservative care.  Patient requesting removal of ingrown nail today. Procedure below.  Discussed procedure and post procedure care and patient expressed understanding.  Will follow-up in 2 weeks for nail check or sooner if any problems arise.    Procedure:  Procedure: partial Nail Avulsion of right hallux lateral nail border.  Surgeon: Asberry Failing, DPM  Pre-op Dx: Ingrown toenail with infection Post-op: Same  Place of Surgery: Office exam room.  Indications for surgery: Painful and ingrown toenail.    The patient is requesting removal of nail without  chemical matrixectomy. Risks and complications were discussed with the patient  for which they understand and written consent was obtained. Under sterile conditions a total of 3 mL of  1% lidocaine  plain was infiltrated in a hallux block fashion. Once anesthetized, the skin was prepped in sterile fashion. A tourniquet was then applied. Next the latearl aspect of hallux nail border was then sharply excised making sure to remove the entire offending nail border.  Next area copiously irrigated. Silvadene was applied. A dry sterile dressing was applied. After application of the dressing the tourniquet was removed and there is found to be an immediate capillary refill time to the digit. The patient tolerated the procedure well without any complications. Post procedure instructions were discussed the patient for which he verbally understood. Follow-up in two weeks for nail check or sooner if any problems are to arise. Discussed signs/symptoms of infection and directed to call the office immediately should any occur or go directly to the emergency room. In the meantime, encouraged to call the office with any questions, concerns, changes symptoms.   Asberry Failing, DPM

## 2024-03-24 NOTE — Patient Instructions (Signed)

## 2024-04-07 ENCOUNTER — Ambulatory Visit: Admitting: Podiatry
